# Patient Record
Sex: Female | Born: 1950 | Race: White | Hispanic: No | Marital: Married | Smoking: Former smoker
Health system: Southern US, Community
[De-identification: ages and names within clinical notes are randomized; demographics above are authoritative.]

## PROBLEM LIST (undated history)

## (undated) DIAGNOSIS — R5383 Other fatigue: Secondary | ICD-10-CM

## (undated) DIAGNOSIS — E559 Vitamin D deficiency, unspecified: Secondary | ICD-10-CM

## (undated) DIAGNOSIS — R002 Palpitations: Secondary | ICD-10-CM

## (undated) DIAGNOSIS — M949 Disorder of cartilage, unspecified: Secondary | ICD-10-CM

## (undated) DIAGNOSIS — M1990 Unspecified osteoarthritis, unspecified site: Secondary | ICD-10-CM

## (undated) DIAGNOSIS — M1710 Unilateral primary osteoarthritis, unspecified knee: Secondary | ICD-10-CM

## (undated) DIAGNOSIS — T7840XA Allergy, unspecified, initial encounter: Secondary | ICD-10-CM

## (undated) DIAGNOSIS — E785 Hyperlipidemia, unspecified: Secondary | ICD-10-CM

## (undated) DIAGNOSIS — M7710 Lateral epicondylitis, unspecified elbow: Secondary | ICD-10-CM

## (undated) DIAGNOSIS — R5381 Other malaise: Secondary | ICD-10-CM

## (undated) DIAGNOSIS — Z86010 Personal history of colonic polyps: Secondary | ICD-10-CM

## (undated) DIAGNOSIS — M76899 Other specified enthesopathies of unspecified lower limb, excluding foot: Secondary | ICD-10-CM

## (undated) DIAGNOSIS — M899 Disorder of bone, unspecified: Secondary | ICD-10-CM

## (undated) DIAGNOSIS — J309 Allergic rhinitis, unspecified: Secondary | ICD-10-CM

## (undated) DIAGNOSIS — N61 Mastitis without abscess: Secondary | ICD-10-CM

## (undated) HISTORY — DX: Mastitis without abscess: N61.0

## (undated) HISTORY — DX: Disorder of bone, unspecified: M89.9

## (undated) HISTORY — DX: Vitamin D deficiency, unspecified: E55.9

## (undated) HISTORY — DX: Allergy, unspecified, initial encounter: T78.40XA

## (undated) HISTORY — DX: Disorder of cartilage, unspecified: M94.9

## (undated) HISTORY — DX: Other specified enthesopathies of unspecified lower limb, excluding foot: M76.899

## (undated) HISTORY — DX: Unspecified osteoarthritis, unspecified site: M19.90

## (undated) HISTORY — DX: Palpitations: R00.2

## (undated) HISTORY — DX: Unilateral primary osteoarthritis, unspecified knee: M17.10

## (undated) HISTORY — DX: Allergic rhinitis, unspecified: J30.9

## (undated) HISTORY — DX: Lateral epicondylitis, unspecified elbow: M77.10

## (undated) HISTORY — DX: Hyperlipidemia, unspecified: E78.5

## (undated) HISTORY — PX: COLONOSCOPY: SHX174

## (undated) HISTORY — DX: Personal history of colonic polyps: Z86.010

## (undated) HISTORY — DX: Other fatigue: R53.83

## (undated) HISTORY — DX: Other malaise: R53.81

---

## 1986-08-04 HISTORY — PX: OTHER SURGICAL HISTORY: SHX169

## 2000-08-04 HISTORY — PX: OOPHORECTOMY: SHX86

## 2000-08-04 HISTORY — PX: ABDOMINAL HYSTERECTOMY: SHX81

## 2003-08-05 HISTORY — PX: OTHER SURGICAL HISTORY: SHX169

## 2003-08-25 ENCOUNTER — Encounter: Payer: Self-pay | Admitting: Internal Medicine

## 2003-08-25 LAB — HM COLONOSCOPY: HM Colonoscopy: ABNORMAL

## 2005-08-04 ENCOUNTER — Encounter: Payer: Self-pay | Admitting: Internal Medicine

## 2005-08-04 LAB — CONVERTED CEMR LAB

## 2005-11-10 ENCOUNTER — Ambulatory Visit: Payer: Self-pay | Admitting: Internal Medicine

## 2005-11-13 ENCOUNTER — Ambulatory Visit: Payer: Self-pay | Admitting: Cardiology

## 2005-12-15 ENCOUNTER — Ambulatory Visit: Payer: Self-pay | Admitting: Internal Medicine

## 2006-02-24 ENCOUNTER — Ambulatory Visit (HOSPITAL_COMMUNITY): Admission: RE | Admit: 2006-02-24 | Discharge: 2006-02-24 | Payer: Self-pay | Admitting: Internal Medicine

## 2006-04-09 ENCOUNTER — Ambulatory Visit: Payer: Self-pay | Admitting: Internal Medicine

## 2006-11-09 ENCOUNTER — Ambulatory Visit: Payer: Self-pay | Admitting: Internal Medicine

## 2006-11-09 LAB — CONVERTED CEMR LAB
AST: 26 units/L (ref 0–37)
Albumin: 3.7 g/dL (ref 3.5–5.2)
Basophils Absolute: 0.1 10*3/uL (ref 0.0–0.1)
Bilirubin Urine: NEGATIVE
Bilirubin, Direct: 0.1 mg/dL (ref 0.0–0.3)
Chloride: 102 meq/L (ref 96–112)
Cholesterol: 196 mg/dL (ref 0–200)
Eosinophils Absolute: 0.1 10*3/uL (ref 0.0–0.6)
GFR calc Af Amer: 166 mL/min
GFR calc non Af Amer: 137 mL/min
Glucose, Bld: 109 mg/dL — ABNORMAL HIGH (ref 70–99)
HDL: 60.2 mg/dL (ref >39.0)
Hemoglobin, Urine: NEGATIVE
Hemoglobin: 13.9 g/dL (ref 12.0–15.0)
Ketones, ur: NEGATIVE mg/dL
LDL Cholesterol: 114 mg/dL — ABNORMAL HIGH (ref 0–99)
Leukocytes, UA: NEGATIVE
MCHC: 34.3 g/dL (ref 30.0–36.0)
MCV: 86 fL (ref 78.0–100.0)
Monocytes Absolute: 0.6 10*3/uL (ref 0.2–0.7)
Monocytes Relative: 7.4 % (ref 3.0–11.0)
Neutrophils Relative %: 77.4 % — ABNORMAL HIGH (ref 43.0–77.0)
Nitrite: NEGATIVE
Potassium: 3.7 meq/L (ref 3.5–5.1)
RBC: 4.7 M/uL (ref 3.87–5.11)
RDW: 12.7 % (ref 11.5–14.6)
Sodium: 140 meq/L (ref 135–145)
Specific Gravity, Urine: >=1.03 (ref 1.000–1.03)
TSH: 1.8 microintl units/mL (ref 0.35–5.50)
Total CHOL/HDL Ratio: 3.3
Total Protein, Urine: NEGATIVE mg/dL
Urine Glucose: NEGATIVE mg/dL
Urobilinogen, UA: 0.2 (ref 0.0–1.0)
pH: 5.5 (ref 5.0–8.0)

## 2006-11-10 ENCOUNTER — Encounter: Payer: Self-pay | Admitting: Internal Medicine

## 2007-03-02 ENCOUNTER — Ambulatory Visit (HOSPITAL_COMMUNITY): Admission: RE | Admit: 2007-03-02 | Discharge: 2007-03-02 | Payer: Self-pay | Admitting: Internal Medicine

## 2007-04-04 ENCOUNTER — Encounter: Payer: Self-pay | Admitting: Internal Medicine

## 2007-04-04 DIAGNOSIS — M76899 Other specified enthesopathies of unspecified lower limb, excluding foot: Secondary | ICD-10-CM

## 2007-04-04 DIAGNOSIS — M1990 Unspecified osteoarthritis, unspecified site: Secondary | ICD-10-CM

## 2007-04-04 DIAGNOSIS — J309 Allergic rhinitis, unspecified: Secondary | ICD-10-CM

## 2007-04-04 DIAGNOSIS — Z860100 Personal history of colon polyps, unspecified: Secondary | ICD-10-CM

## 2007-04-04 DIAGNOSIS — E785 Hyperlipidemia, unspecified: Secondary | ICD-10-CM

## 2007-04-04 HISTORY — DX: Other specified enthesopathies of unspecified lower limb, excluding foot: M76.899

## 2007-04-04 HISTORY — DX: Personal history of colonic polyps: Z86.010

## 2007-04-04 HISTORY — DX: Unspecified osteoarthritis, unspecified site: M19.90

## 2007-04-04 HISTORY — DX: Allergic rhinitis, unspecified: J30.9

## 2007-04-04 HISTORY — DX: Personal history of colon polyps, unspecified: Z86.0100

## 2007-04-04 HISTORY — DX: Hyperlipidemia, unspecified: E78.5

## 2007-05-12 ENCOUNTER — Ambulatory Visit: Payer: Self-pay | Admitting: Internal Medicine

## 2007-05-12 ENCOUNTER — Encounter: Payer: Self-pay | Admitting: Internal Medicine

## 2007-05-12 DIAGNOSIS — R5381 Other malaise: Secondary | ICD-10-CM | POA: Insufficient documentation

## 2007-05-12 DIAGNOSIS — R002 Palpitations: Secondary | ICD-10-CM

## 2007-05-12 DIAGNOSIS — R5383 Other fatigue: Secondary | ICD-10-CM

## 2007-05-12 HISTORY — DX: Other malaise: R53.81

## 2007-05-12 HISTORY — DX: Palpitations: R00.2

## 2007-05-12 LAB — CONVERTED CEMR LAB
AST: 19 units/L (ref 0–37)
Albumin: 3.8 g/dL (ref 3.5–5.2)
Alkaline Phosphatase: 105 units/L (ref 39–117)
BUN: 14 mg/dL (ref 6–23)
Basophils Absolute: 0 10*3/uL (ref 0.0–0.1)
Basophils Relative: 0.1 % (ref 0.0–1.0)
CO2: 29 meq/L (ref 19–32)
Chloride: 104 meq/L (ref 96–112)
Creatinine, Ser: 0.6 mg/dL (ref 0.4–1.2)
HCT: 38.6 % (ref 36.0–46.0)
Hemoglobin: 13.3 g/dL (ref 12.0–15.0)
MCHC: 34.5 g/dL (ref 30.0–36.0)
Neutrophils Relative %: 81.8 % — ABNORMAL HIGH (ref 43.0–77.0)
RBC: 4.53 M/uL (ref 3.87–5.11)
RDW: 12.5 % (ref 11.5–14.6)
Sodium: 141 meq/L (ref 135–145)
Total Bilirubin: 0.9 mg/dL (ref 0.3–1.2)
Total Protein: 7.3 g/dL (ref 6.0–8.3)

## 2007-08-06 ENCOUNTER — Telehealth (INDEPENDENT_AMBULATORY_CARE_PROVIDER_SITE_OTHER): Payer: Self-pay | Admitting: *Deleted

## 2007-09-16 ENCOUNTER — Ambulatory Visit: Payer: Self-pay | Admitting: Cardiology

## 2007-09-23 ENCOUNTER — Ambulatory Visit: Payer: Self-pay | Admitting: Cardiology

## 2007-10-12 ENCOUNTER — Encounter: Payer: Self-pay | Admitting: Cardiology

## 2007-10-12 ENCOUNTER — Ambulatory Visit: Payer: Self-pay

## 2007-10-25 ENCOUNTER — Ambulatory Visit: Payer: Self-pay | Admitting: Cardiology

## 2008-02-03 ENCOUNTER — Ambulatory Visit: Payer: Self-pay | Admitting: Internal Medicine

## 2008-02-03 LAB — CONVERTED CEMR LAB
Alkaline Phosphatase: 95 units/L (ref 39–117)
Bilirubin Urine: NEGATIVE
Bilirubin, Direct: 0.1 mg/dL (ref 0.0–0.3)
Chloride: 105 meq/L (ref 96–112)
GFR calc Af Amer: 134 mL/min
Glucose, Bld: 89 mg/dL (ref 70–99)
HCT: 38.2 % (ref 36.0–46.0)
HDL: 55.4 mg/dL (ref >39.0)
Hemoglobin, Urine: NEGATIVE
Leukocytes, UA: NEGATIVE
Lymphocytes Relative: 15.8 % (ref 12.0–46.0)
Monocytes Absolute: 0.5 10*3/uL (ref 0.1–1.0)
Monocytes Relative: 8 % (ref 3.0–12.0)
Neutrophils Relative %: 73.8 % (ref 43.0–77.0)
Nitrite: NEGATIVE
Platelets: 228 10*3/uL (ref 150–400)
Potassium: 4.1 meq/L (ref 3.5–5.1)
RDW: 12.2 % (ref 11.5–14.6)
Sodium: 140 meq/L (ref 135–145)
Total CHOL/HDL Ratio: 3
Total Protein: 6.9 g/dL (ref 6.0–8.3)
Urobilinogen, UA: 0.2 (ref 0.0–1.0)
VLDL: 16 mg/dL (ref 0–40)

## 2008-02-25 ENCOUNTER — Ambulatory Visit: Payer: Self-pay | Admitting: Internal Medicine

## 2008-03-10 ENCOUNTER — Ambulatory Visit (HOSPITAL_COMMUNITY): Admission: RE | Admit: 2008-03-10 | Discharge: 2008-03-10 | Payer: Self-pay | Admitting: Internal Medicine

## 2008-05-04 ENCOUNTER — Ambulatory Visit: Payer: Self-pay | Admitting: Cardiology

## 2008-08-11 ENCOUNTER — Ambulatory Visit: Payer: Self-pay | Admitting: Internal Medicine

## 2008-08-11 DIAGNOSIS — N61 Mastitis without abscess: Secondary | ICD-10-CM

## 2008-08-11 HISTORY — DX: Mastitis without abscess: N61.0

## 2008-12-06 ENCOUNTER — Ambulatory Visit (HOSPITAL_BASED_OUTPATIENT_CLINIC_OR_DEPARTMENT_OTHER): Admission: RE | Admit: 2008-12-06 | Discharge: 2008-12-06 | Payer: Self-pay | Admitting: Orthopedic Surgery

## 2008-12-06 ENCOUNTER — Ambulatory Visit: Payer: Self-pay | Admitting: Diagnostic Radiology

## 2009-02-06 ENCOUNTER — Ambulatory Visit: Payer: Self-pay | Admitting: Internal Medicine

## 2009-02-06 LAB — CONVERTED CEMR LAB
ALT: 27 units/L (ref 0–35)
AST: 30 units/L (ref 0–37)
Bilirubin Urine: NEGATIVE
Bilirubin, Direct: 0.2 mg/dL (ref 0.0–0.3)
CO2: 30 meq/L (ref 19–32)
Calcium: 9.6 mg/dL (ref 8.4–10.5)
Eosinophils Relative: 1.4 % (ref 0.0–5.0)
Glucose, Bld: 81 mg/dL (ref 70–99)
HDL: 53.6 mg/dL (ref >39.00)
Ketones, ur: NEGATIVE mg/dL
Lymphocytes Relative: 16.9 % (ref 12.0–46.0)
Monocytes Relative: 8.3 % (ref 3.0–12.0)
Neutrophils Relative %: 73 % (ref 43.0–77.0)
Nitrite: NEGATIVE
Platelets: 203 10*3/uL (ref 150.0–400.0)
Potassium: 3.8 meq/L (ref 3.5–5.1)
Sodium: 142 meq/L (ref 135–145)
Total Bilirubin: 1.3 mg/dL — ABNORMAL HIGH (ref 0.3–1.2)
Total CHOL/HDL Ratio: 3
Total Protein, Urine: NEGATIVE mg/dL
VLDL: 20.2 mg/dL (ref 0.0–40.0)
WBC: 6.5 10*3/uL (ref 4.5–10.5)
pH: 5.5 (ref 5.0–8.0)

## 2009-02-12 ENCOUNTER — Ambulatory Visit: Payer: Self-pay | Admitting: Internal Medicine

## 2009-02-12 DIAGNOSIS — IMO0002 Reserved for concepts with insufficient information to code with codable children: Secondary | ICD-10-CM

## 2009-02-12 DIAGNOSIS — E559 Vitamin D deficiency, unspecified: Secondary | ICD-10-CM

## 2009-02-12 DIAGNOSIS — M1710 Unilateral primary osteoarthritis, unspecified knee: Secondary | ICD-10-CM

## 2009-02-12 HISTORY — DX: Reserved for concepts with insufficient information to code with codable children: IMO0002

## 2009-02-12 HISTORY — DX: Vitamin D deficiency, unspecified: E55.9

## 2009-02-13 LAB — CONVERTED CEMR LAB: Vit D, 25-Hydroxy: 30 ng/mL (ref 30–89)

## 2009-03-12 ENCOUNTER — Telehealth: Payer: Self-pay | Admitting: Internal Medicine

## 2009-04-04 ENCOUNTER — Encounter: Payer: Self-pay | Admitting: Internal Medicine

## 2009-04-05 ENCOUNTER — Ambulatory Visit (HOSPITAL_COMMUNITY): Admission: RE | Admit: 2009-04-05 | Discharge: 2009-04-05 | Payer: Self-pay | Admitting: Internal Medicine

## 2009-04-05 ENCOUNTER — Encounter: Payer: Self-pay | Admitting: Internal Medicine

## 2009-04-23 ENCOUNTER — Telehealth: Payer: Self-pay | Admitting: Internal Medicine

## 2010-03-06 ENCOUNTER — Ambulatory Visit: Payer: Self-pay | Admitting: Internal Medicine

## 2010-03-07 ENCOUNTER — Encounter: Payer: Self-pay | Admitting: Internal Medicine

## 2010-03-07 LAB — CONVERTED CEMR LAB
ALT: 14 units/L (ref 0–35)
AST: 25 units/L (ref 0–37)
Albumin: 3.9 g/dL (ref 3.5–5.2)
Alkaline Phosphatase: 102 units/L (ref 39–117)
BUN: 18 mg/dL (ref 6–23)
Basophils Relative: 0.4 % (ref 0.0–3.0)
CO2: 30 meq/L (ref 19–32)
Chloride: 100 meq/L (ref 96–112)
Direct LDL: 162 mg/dL
Eosinophils Relative: 1.6 % (ref 0.0–5.0)
GFR calc non Af Amer: 105.53 mL/min (ref >60)
Glucose, Bld: 94 mg/dL (ref 70–99)
MCV: 86 fL (ref 78.0–100.0)
Monocytes Absolute: 0.5 10*3/uL (ref 0.1–1.0)
Monocytes Relative: 8.5 % (ref 3.0–12.0)
Neutrophils Relative %: 71.6 % (ref 43.0–77.0)
Nitrite: NEGATIVE
Potassium: 4 meq/L (ref 3.5–5.1)
RBC: 4.59 M/uL (ref 3.87–5.11)
Specific Gravity, Urine: >=1.03 (ref 1.000–1.030)
TSH: 2.02 microintl units/mL (ref 0.35–5.50)
Total Protein, Urine: NEGATIVE mg/dL
Total Protein: 7.1 g/dL (ref 6.0–8.3)
Urine Glucose: NEGATIVE mg/dL
Vit D, 25-Hydroxy: 33 ng/mL (ref 30–89)
WBC: 6.1 10*3/uL (ref 4.5–10.5)
pH: 6 (ref 5.0–8.0)

## 2010-03-13 ENCOUNTER — Encounter: Payer: Self-pay | Admitting: Internal Medicine

## 2010-03-13 ENCOUNTER — Ambulatory Visit: Payer: Self-pay | Admitting: Internal Medicine

## 2010-03-13 DIAGNOSIS — M7710 Lateral epicondylitis, unspecified elbow: Secondary | ICD-10-CM | POA: Insufficient documentation

## 2010-03-13 DIAGNOSIS — M899 Disorder of bone, unspecified: Secondary | ICD-10-CM

## 2010-03-13 DIAGNOSIS — M949 Disorder of cartilage, unspecified: Secondary | ICD-10-CM

## 2010-03-13 HISTORY — DX: Lateral epicondylitis, unspecified elbow: M77.10

## 2010-03-13 HISTORY — DX: Disorder of bone, unspecified: M89.9

## 2010-04-09 ENCOUNTER — Ambulatory Visit (HOSPITAL_COMMUNITY): Admission: RE | Admit: 2010-04-09 | Discharge: 2010-04-09 | Payer: Self-pay | Admitting: Internal Medicine

## 2010-04-09 LAB — HM MAMMOGRAPHY: HM Mammogram: NEGATIVE

## 2010-09-03 NOTE — Assessment & Plan Note (Signed)
Summary: PHYSICAL  PER PT  STC   Vital Signs:  Patient profile:   60 year old female Height:      64 inches Weight:      163.50 pounds BMI:     28.17 O2 Sat:      99 % on Room air Temp:     97.6 degrees F oral Pulse rate:   70 / minute BP sitting:   110 / 72  (left arm) Cuff size:   regular  Vitals Entered By: Zella Ball Ewing CMA Duncan Dull) (March 13, 2010 8:47 AM)  O2 Flow:  Room air  Preventive Care Screening  Mammogram:    Next Due:  04/2010  Colonoscopy:    Date:  08/25/2003    Next Due:  09/2010    Results:  abnormal   Last Tetanus Booster:    Date:  03/13/2010    Results:  Tdap  Pap Smear:    Date:  10/02/2009    Results:  normal   CC: Adult Physical/Re   CC:  Adult Physical/Re.  History of Present Illness: here to f/u;  taking the vit d well, but stopped the lipitor to see if diet alone would work; no apparent myalgias or side effects; Pt denies CP, sob, doe, wheezing, orthopnea, pnd, worsening LE edema, palps, dizziness or syncope  Pt denies new neuro symptoms such as headache, facial or extremity weakness  Does have some  mild right elbow pain, swelling  worse with more computer typing, better with the nsaid, overall mild. Right hip bursitis has improved  overall ;  Has been doing more wlaking - daily for 3 mile s- sometime farther;  runs for 3/4 mile now stopping running.  Lost 14 lbs overall per pt.  No fever, wt loss, night sweats, loss of appetite or other constitutional symptoms  Denies depressive symtpoms or suicidal ideation, no panic. Now retired, but may go back part -time.    Preventive Screening-Counseling & Management      Drug Use:  no.    Problems Prior to Update: 1)  Osteoarthritis, Knee, Right  (ICD-715.96) 2)  Unspecified Vitamin D Deficiency  (ICD-268.9) 3)  Preventive Health Care  (ICD-V70.0) 4)  Mastitis  (ICD-611.0) 5)  Preventive Health Care  (ICD-V70.0) 6)  Fatigue  (ICD-780.79) 7)  Palpitations  (ICD-785.1) 8)  Family History of Cad  Female 1st Degree Relative <60  (ICD-V16.49) 9)  Osteoarthritis  (ICD-715.90) 10)  Hyperlipidemia  (ICD-272.4) 11)  Colonic Polyps, Hx of  (ICD-V12.72) 12)  Allergic Rhinitis  (ICD-477.9) 13)  Bursitis, Right Hip  (ICD-726.5)  Medications Prior to Update: 1)  Lipitor 80 Mg  Tabs (Atorvastatin Calcium) .Marland Kitchen.. 1 By Mouth Once Daily 2)  Vitamin D3 1000 Unit  Tabs (Cholecalciferol) .Marland Kitchen.. 1 By Mouth Once Daily 3)  Diclofenac Sodium 75 Mg Tbec (Diclofenac Sodium) .Marland Kitchen.. 1 By Mouth Two Times A Day  Current Medications (verified): 1)  Lipitor 80 Mg  Tabs (Atorvastatin Calcium) .Marland Kitchen.. 1 By Mouth Once Daily 2)  Vitamin D3 1000 Unit  Tabs (Cholecalciferol) .Marland Kitchen.. 1 By Mouth Once Daily 3)  Diclofenac Sodium 75 Mg Tbec (Diclofenac Sodium) .Marland Kitchen.. 1 By Mouth Two Times A Day As Needed Pain 4)  Aspir-Low 81 Mg Tbec (Aspirin) .Marland Kitchen.. 1po Once Daily  Allergies (verified): 1)  ! 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 Hemorrhoidectomy-1988 Hysterectomy- 2002 Kidney Stone Removal- 2005 Oophorectomy- 2002  Family History: Reviewed history from 05/12/2007 and no changes required. Family History of CAD Female 1st degree relative <60 sister with prot s defic  Social History: Reviewed history from 05/12/2007 and no changes required. 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         all otherwise negative per pt -    Physical Exam  General:  alert and overweight-appearing.   Head:  normocephalic and atraumatic.   Eyes:  vision grossly intact, pupils equal, and pupils round.   Ears:  R ear normal and L ear normal.   Nose:  no external deformity and no nasal discharge.   Mouth:  no gingival abnormalities and pharynx pink and moist.   Neck:  supple and no masses.   Lungs:  normal respiratory effort and normal breath sounds.   Heart:  normal rate and regular rhythm.   Abdomen:  soft, non-tender, and normal bowel sounds.   Msk:  no joint tenderness and no joint swelling.  except for mild tender, swelling to right epicondylar area Extremities:  no edema, no erythema  Neurologic:  cranial nerves II-XII intact and strength normal in all extremities.     Impression & Recommendations:  Problem # 1:  Preventive Health Care (ICD-V70.0) Overall doing well, age appropriate education and counseling updated and referral for appropriate preventive services done unless declined, immunizations up to date or declined, diet counseling done if overweight, urged to quit smoking if smokes , most recent labs reviewed and current ordered if appropriate, ecg reviewed or declined (interpretation per ECG scanned in the EMR if done); information regarding Medicare Prevention requirements given if appropriate; speciality referrals updated as appropriate , to also start asa81 mg - 1 per day Orders: EKG w/ Interpretation  (93000)  Problem # 2:  HYPERLIPIDEMIA (ICD-272.4)  Her updated medication list for this problem includes:    Lipitor 80 Mg Tabs (Atorvastatin calcium) .Marland Kitchen... 1 by mouth once daily to re-start lipitor  - gave rx   Problem # 3:  LATERAL EPICONDYLITIS, RIGHT (ICD-726.32) mild , for nsaid as needed, f/u any worsening symptoms  Complete Medication List: 1)  Lipitor 80 Mg Tabs (Atorvastatin calcium) .Marland Kitchen.. 1 by mouth once daily 2)  Vitamin D3 1000 Unit Tabs (Cholecalciferol) .Marland Kitchen.. 1 by mouth once daily 3)  Diclofenac Sodium 75 Mg Tbec (Diclofenac sodium) .Marland Kitchen.. 1 by mouth two times a day as needed pain 4)  Aspir-low 81 Mg Tbec (Aspirin) .Marland Kitchen.. 1po once daily  Other Orders: Tdap => 30yrs IM (40102) Admin 1st Vaccine (72536)  Patient Instructions: 1)  you had the tetanus shot today 2)  Continue all previous medications as before this visit , including re-start the lipitor 3)  Take an Aspirin every  day - 81 mg - 1 per day - COATED only 4)  Please schedule a follow-up appointment in 1 year or sooner if needed Prescriptions: DICLOFENAC SODIUM 75 MG TBEC (DICLOFENAC SODIUM) 1 by mouth two times a day as needed pain  #60 x 3   Entered and Authorized by:   Corwin Levins MD   Signed by:   Corwin Levins MD on 03/13/2010   Method used:   Print then Give to Patient   RxID:   [id number] LIPITOR 80 MG  TABS (ATORVASTATIN CALCIUM) 1 by mouth once daily  #90 x 3   Entered and Authorized by:   Corwin Levins MD   Signed by:   Corwin Levins MD on 03/13/2010   Method used:   Print then Give to Patient   RxID:   [id number]    Immunizations Administered:  Tetanus Vaccine:    Vaccine Type: Tdap    Site: left deltoid    Mfr: GlaxoSmithKline    Dose: 0.5 ml    Route: IM    Given by: Zella Ball Ewing CMA (AAMA)    Exp. Date: 02/01/2012    Lot #: [id number]    VIS given: 06/22/07 version given March 13, 2010.

## 2010-09-03 NOTE — Procedures (Signed)
Summary: Kathrene Alu MD  Kathrene Alu MD   Imported By: Lester Adrian 03/15/2010 08:02:48  _____________________________________________________________________  External Attachment:    Type:   Image     Comment:   External Document

## 2010-12-17 NOTE — Assessment & Plan Note (Signed)
[facility]                            CARDIOLOGY OFFICE NOTE   NAME:NOWLINNickisha, Hum                    MRN:          [id number]  DATE:09/16/2007                            DOB:          20-Dec-1950    [name] is a very pleasant 60 year old female who presents  evaluation of chest pain and palpitations.  She has no prior cardiac  history.  She typically does not have significant dyspnea on exertion,  orthopnea, PND, exertional chest pain or syncope.  Since the fall she  has had intermittent palpitations.  These were sudden onset and  described as her heart racing.  They last 5 to 10 minutes and resolve  spontaneously.  There is mild presyncope, but there is no syncope.  There is also a tightness in her chest as well as dyspnea.  These have  increased in frequency recently and are occurring at least one time per  week.  We would therefore asked to further evaluate.  Note, she has not  had exertional chest pain at other times.  Her medications include  indomethacin 25 mg tablets 2 p.o. every 12 hours, Lipitor 80 mg p.o.  daily, and estradiol.  She has an allergy to SULFA.   FAMILY HISTORY:  Is positive coronary disease.  Her father had  myocardial infarction and died in his mid 67s.  Her mother had coronary  disease but it was in her 68s.   SOCIAL HISTORY:  She does not smoke.  She consumes approximately 2  alcoholic beverages per week.   PAST MEDICAL HISTORY:  There is no diabetes mellitus, hypertension, but  there is hyperlipidemia.  She has some bursitis in her right hip.  She  had a history of nephrolithiasis.  She is had a prior hysterectomy as  well as hemorrhoidectomy.   REVIEW OF SYSTEMS:  She denies any headaches at present although she has  had some frontal headaches over the right eye recently.  There is also  occasionally dizziness with certain movements.  She has had not had a  productive cough.  There is no hemoptysis.  There is  no dysphagia,  odynophagia, melena or hematochezia.  There is no rash or seizure  activity.  There is no orthopnea, PND or pedal edema.  Remaining systems  are negative.   PHYSICAL EXAMINATION:  Today shows a blood pressure of 119/74, pulse is  63.  She weighs 108 pounds.  She is well-developed, somewhat obese.  She is no acute distress at  present point at present.  Her skin is warm and dry.  She is not depressed.  There is no pleural  clubbing.  BACK:  Normal.  HEENT:  Normal with normal eyelids.  NECK:  Supple with normal upstroke bilaterally.  No bruits noted.  There  is no jugular distention and no thyromegaly noted.  Chest is clear to auscultation.  Normal expansion.  CARDIOVASCULAR:  Regular rhythm.  Normal S1-S2.  No murmurs, rubs or  gallops noted.  PMI is non is not palpated.  ABDOMEN:  Exam nontender.  Positive bowel sounds hepatosplenomegaly no  mass appreciated.  There is no abdominal bruit.  She is 2+ femoral  pulses bilaterally.  No bruits.  EXTREMITIES:  Show no edema and I palpate no cords.  She is 2+ posterior  tibial pulses bilaterally.  NEUROLOGIC:  Grossly intact.   Her electrocardiogram shows a sinus rhythm at a rate of 63.  The axis is  normal.  No significant ST changes.   DIAGNOSES:  1. Palpitations - description of her symptoms is concerning for an      SVT.  We will schedule her an event monitor to more fully evaluate.      If it indeed is SVT we could treat with a beta blocker versus refer      to the EP for consideration of ablation.  I will also schedule her      to have an echocardiogram to quantify LV function.  We will also      check a TSH.  2. Chest pain - her symptoms occur only with palpitations.  She does      not have this other times.  I will check a stress echocardiogram in      light of her family history.  If it shows no significant      abnormalities,  we will not pursue further evaluation.  3. Hyperlipidemia - she will continue on  statin and this is being      managed by Dr. Jonny Ruiz.  4. History of nephrolithiasis.   I will see her back in 6-8 weeks to review the above information.     Madolyn Frieze Jens Som, MD, [facility]  Electronically Signed    BSC/MedQ  DD: 09/16/2007  DT: 09/17/2007  Job #: [id number]   cc:   Corwin Levins, MD

## 2010-12-17 NOTE — Assessment & Plan Note (Signed)
[facility]                            CARDIOLOGY OFFICE NOTE   NAME:[name], [name]                    MRN:          [id number]  DATE:10/25/2007                            DOB:          07/02/1951    [name] is a 60 year old female whom I recently saw on September 16, 2007, for chest pain and palpitations.  We did schedule the patient to  have an echocardiogram which was performed on October 12, 2007.  Ejection  fraction was normal.  There is mild LVH.  There was trivial mitral  regurgitation.  She also had a stress echocardiogram.  The patient  exercised for 4 minutes 13 seconds.  There was no chest pain during the  study.  There were no electrographic changes.  There were no stress-  induced wall motion abnormalities.  Finally, we performed a CardioNet  monitor.  There were occasional PVCs associated with the palpitations.  Since then, she has not had dyspnea. She occasionally feels a brief,  sharp pain in her chest but does not have exertional chest pain.  She  does continue to have palpitations.   MEDICATIONS:  1. Lipitor 80 mg p.o. daily.  2. Estradiol.   PHYSICAL EXAMINATION:  VITAL SIGNS:  Exam today shows a blood pressure  of 126/91 and pulse of 85.  She weighs 182 pounds.  HEENT:  Normal.  NECK:  Supple.  CHEST:  Clear.  CARDIOVASCULAR:  Regular rate.  ABDOMEN:  Exam shows no tenderness.  EXTREMITIES:  Show no edema.   DIAGNOSES:  1. Palpitations felt secondary to PACs.  These do continue to be      symptomatic.  We will, therefore, add atenolol 25 mg p.o. daily to      see if this will help her symptoms.  She will take this in the      evening.  2. Chest pain.  Her stress echocardiogram was unremarkable and will      not pursue further ischemia evaluation.  3. Hyperlipidemia.  She will continue on a statin,  and this is      being managed by Dr. Jonny Ruiz.  4. History of nephrolithiasis.   We will see back in 6 months.     Madolyn Frieze Jens Som, MD, [facility]  Electronically Signed   BSC/MedQ  DD: 10/25/2007  DT: 10/25/2007  Job #: [id number]   cc:   Corwin Levins, MD

## 2010-12-17 NOTE — Assessment & Plan Note (Signed)
[facility]                            CARDIOLOGY OFFICE NOTE   NAME:NOWLINHavanna, [name]                    MRN:          [id number]  DATE:05/04/2008                            DOB:          [birth year]    [name] is a pleasant 60 year old female who has a history of chest  pain and palpitations.  Previous stress echocardiogram performed on  October 12, 2007, showed no significant abnormalities.  She also had a  previous echocardiogram on October 12, 2007, that showed normal LV  function, trivial mitral regurgitation.  A previous TSH was normal as  well.  A CardioNet showed occasional PVCs associated with palpitations.  We did place her on atenolol in March; however, she has since  discontinued this, and her palpitations have completely resolved.  She  is not having any chest pain or dyspnea, and there is no pedal edema.   Her medications include,  1. Lipitor 80 mg p.o. daily.  2. Estradiol.  3. Nalfon 200 mg p.o. t.i.d.   PHYSICAL EXAMINATION:  VITAL SIGNS:  Blood pressure of 118/56 and a  pulse of 71.  HEENT:  Normal.  NECK:  Supple.  CHEST:  Clear.  CARDIOVASCULAR:  Regular rate and rhythm.  ABDOMEN:  No tenderness.  EXTREMITIES:  No edema.   Electrocardiogram shows sinus rhythm at a rate of 68.  There are no  significant ST changes.   DIAGNOSES:  1. History of palpitations, felt secondary to premature atrial      contractions and premature ventricular contractions - each had      completely resolved and she is now off atenolol.  I have asked her      to take this on an as-needed basis.  2. History of chest pain - this has completely resolved as well, and      her previous stress echocardiogram was normal.  We will not pursue      this further.  3. Hyperlipidemia - she will continue on her statin and this is being      followed by Dr. Jonny Ruiz.  4. History of nephrolithiasis.   We will see back on an as-needed basis.     [name]  Mary Som, MD, [facility]  Electronically Signed    BSC/MedQ  DD: 05/04/2008  DT: 05/05/2008  Job #: [id number]

## 2011-03-18 ENCOUNTER — Telehealth: Payer: Self-pay

## 2011-03-18 DIAGNOSIS — M8580 Other specified disorders of bone density and structure, unspecified site: Secondary | ICD-10-CM

## 2011-03-18 NOTE — Telephone Encounter (Signed)
Done per emr 

## 2011-03-18 NOTE — Telephone Encounter (Signed)
Pt called requesting order to [facility] for a Dexa scan.

## 2011-03-19 ENCOUNTER — Other Ambulatory Visit: Payer: Self-pay | Admitting: Internal Medicine

## 2011-03-19 DIAGNOSIS — Z1231 Encounter for screening mammogram for malignant neoplasm of breast: Secondary | ICD-10-CM

## 2011-03-19 NOTE — Telephone Encounter (Signed)
Pt informed

## 2011-03-21 ENCOUNTER — Other Ambulatory Visit: Payer: Self-pay | Admitting: Internal Medicine

## 2011-03-28 ENCOUNTER — Telehealth: Payer: Self-pay

## 2011-03-28 DIAGNOSIS — Z0000 Encounter for general adult medical examination without abnormal findings: Secondary | ICD-10-CM

## 2011-03-28 NOTE — Telephone Encounter (Signed)
Put order in for labs. 

## 2011-04-16 ENCOUNTER — Ambulatory Visit (HOSPITAL_COMMUNITY)
Admission: RE | Admit: 2011-04-16 | Discharge: 2011-04-16 | Disposition: A | Discharge status: Home / Self Care | Payer: BC Managed Care – PPO | Source: Ambulatory Visit | Attending: Internal Medicine | Admitting: Internal Medicine

## 2011-04-16 DIAGNOSIS — Z1231 Encounter for screening mammogram for malignant neoplasm of breast: Secondary | ICD-10-CM | POA: Insufficient documentation

## 2011-04-16 DIAGNOSIS — M8580 Other specified disorders of bone density and structure, unspecified site: Secondary | ICD-10-CM

## 2011-04-29 ENCOUNTER — Encounter: Payer: Self-pay | Admitting: Internal Medicine

## 2011-05-08 ENCOUNTER — Encounter: Payer: Self-pay | Admitting: Internal Medicine

## 2011-05-26 ENCOUNTER — Other Ambulatory Visit (INDEPENDENT_AMBULATORY_CARE_PROVIDER_SITE_OTHER): Payer: BC Managed Care – PPO

## 2011-05-26 DIAGNOSIS — Z0000 Encounter for general adult medical examination without abnormal findings: Secondary | ICD-10-CM

## 2011-05-26 LAB — URINALYSIS, ROUTINE W REFLEX MICROSCOPIC
Bilirubin Urine: NEGATIVE
Ketones, ur: NEGATIVE
Leukocytes, UA: NEGATIVE
Urobilinogen, UA: 0.2 (ref 0.0–1.0)

## 2011-05-26 LAB — BASIC METABOLIC PANEL
BUN: 15 mg/dL (ref 6–23)
CO2: 29 mEq/L (ref 19–32)
Chloride: 105 mEq/L (ref 96–112)
Glucose, Bld: 88 mg/dL (ref 70–99)
Potassium: 4.4 mEq/L (ref 3.5–5.1)

## 2011-05-26 LAB — HEPATIC FUNCTION PANEL
ALT: 24 U/L (ref 0–35)
AST: 26 U/L (ref 0–37)
Albumin: 3.9 g/dL (ref 3.5–5.2)
Total Protein: 7 g/dL (ref 6.0–8.3)

## 2011-05-26 LAB — LIPID PANEL
LDL Cholesterol: 92 mg/dL (ref 0–99)
Total CHOL/HDL Ratio: 3
VLDL: 19.2 mg/dL (ref 0.0–40.0)

## 2011-05-26 LAB — CBC WITH DIFFERENTIAL/PLATELET
Basophils Relative: 0.6 % (ref 0.0–3.0)
Eosinophils Relative: 1.7 % (ref 0.0–5.0)
Lymphocytes Relative: 18 % (ref 12.0–46.0)
MCV: 87.2 fl (ref 78.0–100.0)
Monocytes Absolute: 0.4 10*3/uL (ref 0.1–1.0)
Neutrophils Relative %: 70.3 % (ref 43.0–77.0)
RBC: 4.44 Mil/uL (ref 3.87–5.11)
WBC: 4.7 10*3/uL (ref 4.5–10.5)

## 2011-05-26 LAB — TSH: TSH: 1.48 u[IU]/mL (ref 0.35–5.50)

## 2011-06-01 ENCOUNTER — Encounter: Payer: Self-pay | Admitting: Internal Medicine

## 2011-06-01 DIAGNOSIS — Z0001 Encounter for general adult medical examination with abnormal findings: Secondary | ICD-10-CM | POA: Insufficient documentation

## 2011-06-01 DIAGNOSIS — Z0000 Encounter for general adult medical examination without abnormal findings: Secondary | ICD-10-CM

## 2011-06-02 ENCOUNTER — Encounter: Payer: Self-pay | Admitting: Internal Medicine

## 2011-06-02 ENCOUNTER — Ambulatory Visit (INDEPENDENT_AMBULATORY_CARE_PROVIDER_SITE_OTHER): Payer: BC Managed Care – PPO | Admitting: Internal Medicine

## 2011-06-02 VITALS — BP 112/80 | HR 60 | Temp 98.0°F | Ht 63.5 in | Wt 162.5 lb

## 2011-06-02 DIAGNOSIS — Z23 Encounter for immunization: Secondary | ICD-10-CM

## 2011-06-02 DIAGNOSIS — Z0000 Encounter for general adult medical examination without abnormal findings: Secondary | ICD-10-CM

## 2011-06-02 MED ORDER — DICLOFENAC SODIUM 75 MG PO TBEC: 75.0000 mg | DELAYED_RELEASE_TABLET | Freq: Every day | ORAL | Status: DC

## 2011-06-02 MED ORDER — ATORVASTATIN CALCIUM 80 MG PO TABS: 80.0000 mg | ORAL_TABLET | Freq: Every day | ORAL | Status: DC

## 2011-06-02 NOTE — Patient Instructions (Signed)
You had the flu shot today Continue all other medications as before Please remember to followup with your GYN for the yearly pap smear and/or mammogram - about April 2013 Please return in 1 year for your yearly visit, or sooner if needed, with Lab testing done 3-5 days before

## 2011-06-02 NOTE — Assessment & Plan Note (Signed)

## 2011-06-02 NOTE — Progress Notes (Signed)
Subjective:    Patient ID: [name], female    DOB: Dec 11, 1950, 60 y.o.   MRN: [id number]  HPI  Here for wellness and f/u;  Overall doing ok;  Pt denies CP, worsening SOB, DOE, wheezing, orthopnea, PND, worsening LE edema, palpitations, dizziness or syncope.  Pt denies neurological change such as new Headache, facial or extremity weakness.  Pt denies polydipsia, polyuria, or low sugar symptoms. Pt states overall good compliance with treatment and medications, good tolerability, and trying to follow lower cholesterol diet.  Pt denies worsening depressive symptoms, suicidal ideation or panic. No fever, wt loss, night sweats, loss of appetite, or other constitutional symptoms.  Pt states good ability with ADL's, low fall risk, home safety reviewed and adequate, no significant changes in hearing or vision, and active with exercise near daily.  Due for flu shot.  No other acute complaints.   Past Medical History  Diagnosis Date  . ALLERGIC RHINITIS 04/04/2007  . BURSITIS, RIGHT HIP 04/04/2007  . COLONIC POLYPS, HX OF 04/04/2007  . FATIGUE 05/12/2007  . HYPERLIPIDEMIA 04/04/2007  . LATERAL EPICONDYLITIS, RIGHT 03/13/2010  . 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 OSTEOARTHRITIS, KNEE, RIGHT 02/12/2009  . OSTEOARTHRITIS 04/04/2007  . OSTEOPENIA 03/13/2010  . Palpitations 05/12/2007  . Unspecified vitamin D deficiency 02/12/2009   Past Surgical History  Procedure Date  . Hemorrhoidectomoy 1988  . Abdominal hysterectomy 2002  . Kidney stone removal 2005  . Oophorectomy 2002    reports that she has never smoked. She does not have any smokeless tobacco history on file. She reports that she drinks alcohol. She reports that she does not use illicit drugs. family history includes Coronary artery disease in her paternal grandfather. Allergies  Allergen Reactions  . Sulfonamide Derivatives    Current Outpatient Prescriptions on File Prior to Visit  Medication Sig Dispense Refill  . aspirin 81 MG tablet Take 81 mg by  mouth daily.        . Cholecalciferol (VITAMIN D3) 1000 UNITS CAPS Take by mouth daily.         Review of Systems Review of Systems  Constitutional: Negative for diaphoresis, activity change, appetite change and unexpected weight change.  HENT: Negative for hearing loss, ear pain, facial swelling, mouth sores and neck stiffness.   Eyes: Negative for pain, redness and visual disturbance.  Respiratory: Negative for shortness of breath and wheezing.   Cardiovascular: Negative for chest pain and palpitations.  Gastrointestinal: Negative for diarrhea, blood in stool, abdominal distention and rectal pain.  Genitourinary: Negative for hematuria, flank pain and decreased urine volume.  Musculoskeletal: Negative for myalgias and joint swelling.  Skin: Negative for color change and wound.  Neurological: Negative for syncope and numbness.  Hematological: Negative for adenopathy.  Psychiatric/Behavioral: Negative for hallucinations, self-injury, decreased concentration and agitation.      Objective:   Physical Exam BP 112/80  Pulse 60  Temp(Src) 98 F (36.7 C) (Oral)  Ht 5' 3.5" (1.613 m)  Wt 162 lb 8 oz (73.71 kg)  BMI 28.33 kg/m2  SpO2 97% Physical Exam  VS noted, obese Constitutional: Pt is oriented to person, place, and time. Appears well-developed and well-nourished.  HENT:  Head: Normocephalic and atraumatic.  Right Ear: External ear normal.  Left Ear: External ear normal.  Nose: Nose normal.  Mouth/Throat: Oropharynx is clear and moist.  Eyes: Conjunctivae and EOM are normal. Pupils are equal, round, and reactive to light.  Neck: Normal range of motion. Neck supple. No JVD present. No tracheal deviation present.  Cardiovascular: Normal rate, regular rhythm, normal heart sounds and intact distal pulses.   Pulmonary/Chest: Effort normal and breath sounds normal.  Abdominal: Soft. Bowel sounds are normal. There is no tenderness.  Musculoskeletal: Normal range of motion. Exhibits no  edema.  Lymphadenopathy:  Has no cervical adenopathy.  Neurological: Pt is alert and oriented to person, place, and time. Pt has normal reflexes. No cranial nerve deficit.  Skin: Skin is warm and dry. No rash noted.  Psychiatric:  Has  normal mood and affect. Behavior is normal.     Assessment & Plan:

## 2012-03-23 ENCOUNTER — Other Ambulatory Visit: Payer: Self-pay | Admitting: Internal Medicine

## 2012-03-23 DIAGNOSIS — Z1231 Encounter for screening mammogram for malignant neoplasm of breast: Secondary | ICD-10-CM

## 2012-04-16 ENCOUNTER — Ambulatory Visit (HOSPITAL_COMMUNITY)
Admission: RE | Admit: 2012-04-16 | Discharge: 2012-04-16 | Disposition: A | Discharge status: Home / Self Care | Payer: BC Managed Care – PPO | Source: Ambulatory Visit | Attending: Internal Medicine | Admitting: Internal Medicine

## 2012-04-16 DIAGNOSIS — Z1231 Encounter for screening mammogram for malignant neoplasm of breast: Secondary | ICD-10-CM | POA: Insufficient documentation

## 2012-05-27 ENCOUNTER — Other Ambulatory Visit (INDEPENDENT_AMBULATORY_CARE_PROVIDER_SITE_OTHER): Payer: BC Managed Care – PPO

## 2012-05-27 DIAGNOSIS — Z0000 Encounter for general adult medical examination without abnormal findings: Secondary | ICD-10-CM

## 2012-05-27 LAB — URINALYSIS, ROUTINE W REFLEX MICROSCOPIC
Hgb urine dipstick: NEGATIVE
Ketones, ur: NEGATIVE
Leukocytes, UA: NEGATIVE
Specific Gravity, Urine: 1.02 (ref 1.000–1.030)
Urobilinogen, UA: 0.2 (ref 0.0–1.0)

## 2012-05-27 LAB — CBC WITH DIFFERENTIAL/PLATELET
Basophils Relative: 0.7 % (ref 0.0–3.0)
Eosinophils Absolute: 0.1 10*3/uL (ref 0.0–0.7)
Eosinophils Relative: 1.1 % (ref 0.0–5.0)
HCT: 41.6 % (ref 36.0–46.0)
Lymphs Abs: 1 10*3/uL (ref 0.7–4.0)
MCHC: 33.3 g/dL (ref 30.0–36.0)
MCV: 87.3 fl (ref 78.0–100.0)
Monocytes Absolute: 0.5 10*3/uL (ref 0.1–1.0)
Neutrophils Relative %: 74.2 % (ref 43.0–77.0)
RBC: 4.77 Mil/uL (ref 3.87–5.11)

## 2012-05-28 LAB — HEPATIC FUNCTION PANEL
ALT: 28 U/L (ref 0–35)
Albumin: 3.8 g/dL (ref 3.5–5.2)
Bilirubin, Direct: 0.2 mg/dL (ref 0.0–0.3)
Total Protein: 7.6 g/dL (ref 6.0–8.3)

## 2012-05-28 LAB — LIPID PANEL
Cholesterol: 189 mg/dL (ref 0–200)
HDL: 53.2 mg/dL (ref >39.00)
LDL Cholesterol: 114 mg/dL — ABNORMAL HIGH (ref 0–99)
Triglycerides: 108 mg/dL (ref 0.0–149.0)
VLDL: 21.6 mg/dL (ref 0.0–40.0)

## 2012-05-28 LAB — BASIC METABOLIC PANEL
Chloride: 106 mEq/L (ref 96–112)
GFR: 99.15 mL/min (ref >60.00)
Potassium: 4.2 mEq/L (ref 3.5–5.1)
Sodium: 140 mEq/L (ref 135–145)

## 2012-06-02 ENCOUNTER — Encounter: Payer: Self-pay | Admitting: Internal Medicine

## 2012-06-02 ENCOUNTER — Ambulatory Visit (INDEPENDENT_AMBULATORY_CARE_PROVIDER_SITE_OTHER): Payer: BC Managed Care – PPO | Admitting: Internal Medicine

## 2012-06-02 VITALS — BP 110/70 | HR 84 | Temp 97.5°F | Ht 64.0 in | Wt 169.1 lb

## 2012-06-02 DIAGNOSIS — Z0000 Encounter for general adult medical examination without abnormal findings: Secondary | ICD-10-CM

## 2012-06-02 DIAGNOSIS — Z23 Encounter for immunization: Secondary | ICD-10-CM

## 2012-06-02 MED ORDER — ATORVASTATIN CALCIUM 80 MG PO TABS: 80.0000 mg | ORAL_TABLET | Freq: Every day | ORAL | Status: DC

## 2012-06-02 MED ORDER — DICLOFENAC SODIUM 75 MG PO TBEC: 75.0000 mg | DELAYED_RELEASE_TABLET | Freq: Every day | ORAL | Status: DC

## 2012-06-02 NOTE — Patient Instructions (Addendum)
Please remember to followup with your GYN for the yearly pap smear and/or mammogram as you do You had the flu shot today Your EKG and labs were ok today Please continue your efforts at being more active, low cholesterol diet, and weight control, as you are doing Your refills were done today in hardcopy Your colonoscopy ill be due next in 2015 Please return in 1 year for your yearly visit, or sooner if needed, with Lab testing done 3-5 days before

## 2012-06-02 NOTE — Assessment & Plan Note (Signed)

## 2012-06-02 NOTE — Progress Notes (Signed)
Subjective:    Patient ID: [name], female    DOB: 1950/08/16, 61 y.o.   MRN: [id number]  HPI  Here for wellness and f/u;  Overall doing ok;  Pt denies CP, worsening SOB, DOE, wheezing, orthopnea, PND, worsening LE edema, palpitations, dizziness or syncope.  Pt denies neurological change such as new Headache, facial or extremity weakness.  Pt denies polydipsia, polyuria, or low sugar symptoms. Pt states overall good compliance with treatment and medications, good tolerability, and trying to follow lower cholesterol diet.  Pt denies worsening depressive symptoms, suicidal ideation or panic. No fever, wt loss, night sweats, loss of appetite, or other constitutional symptoms.  Pt states good ability with ADL's, low fall risk, home safety reviewed and adequate, no significant changes in hearing or vision, and has been trying to be more active with walking recently [place] walking [place].  Tries to walk 45 min per day. For flu shot today.  No acute complaints Past Medical History  Diagnosis Date  . ALLERGIC RHINITIS 04/04/2007  . BURSITIS, RIGHT HIP 04/04/2007  . COLONIC POLYPS, HX OF 04/04/2007  . FATIGUE 05/12/2007  . HYPERLIPIDEMIA 04/04/2007  . LATERAL EPICONDYLITIS, RIGHT 03/13/2010  . 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 OSTEOARTHRITIS, KNEE, RIGHT 02/12/2009  . OSTEOARTHRITIS 04/04/2007  . OSTEOPENIA 03/13/2010  . Palpitations 05/12/2007  . Unspecified vitamin D deficiency 02/12/2009   Past Surgical History  Procedure Date  . Hemorrhoidectomoy 1988  . Abdominal hysterectomy 2002  . Kidney stone removal 2005  . Oophorectomy 2002    reports that she has never smoked. She does not have any smokeless tobacco history on file. She reports that she drinks alcohol. She reports that she does not use illicit drugs. family history includes Coronary artery disease in her paternal grandfather. Allergies  Allergen Reactions  . Sulfonamide Derivatives    Current Outpatient Prescriptions on File  Prior to Visit  Medication Sig Dispense Refill  . aspirin 81 MG tablet Take 81 mg by mouth daily.        . calcium carbonate (OS-CAL) 600 MG TABS Take 600 mg by mouth daily.        . Cholecalciferol (VITAMIN D3) 1000 UNITS CAPS Take by mouth daily.        Marland Kitchen DISCONTD: atorvastatin (LIPITOR) 80 MG tablet Take 1 tablet (80 mg total) by mouth daily.  90 tablet  3   Review of Systems Review of Systems  Constitutional: Negative for diaphoresis, activity change, appetite change and unexpected weight change.  HENT: Negative for hearing loss, ear pain, facial swelling, mouth sores and neck stiffness.   Eyes: Negative for pain, redness and visual disturbance.  Respiratory: Negative for shortness of breath and wheezing.   Cardiovascular: Negative for chest pain and palpitations.  Gastrointestinal: Negative for diarrhea, blood in stool, abdominal distention and rectal pain.  Genitourinary: Negative for hematuria, flank pain and decreased urine volume.  Musculoskeletal: Negative for myalgias and joint swelling.  Skin: Negative for color change and wound.  Neurological: Negative for syncope and numbness.  Hematological: Negative for adenopathy.  Psychiatric/Behavioral: Negative for hallucinations, self-injury, decreased concentration and agitation.      Objective:   Physical Exam BP 110/70  Pulse 84  Temp 97.5 F (36.4 C) (Oral)  Ht 5\' 4"  (1.626 m)  Wt 169 lb 2 oz (76.715 kg)  BMI 29.03 kg/m2  SpO2 96% Physical Exam  VS noted Constitutional: Pt is oriented to person, place, and time. Appears well-developed and well-nourished. Lavella Lemons HENT:  Head: Normocephalic and atraumatic.  Right Ear: External ear normal.  Left Ear: External ear normal.  Nose: Nose normal.  Mouth/Throat: Oropharynx is clear and moist.  Eyes: Conjunctivae and EOM are normal. Pupils are equal, round, and reactive to light.  Neck: Normal range of motion. Neck supple. No JVD present. No tracheal deviation present.    Cardiovascular: Normal rate, regular rhythm, normal heart sounds and intact distal pulses.   Pulmonary/Chest: Effort normal and breath sounds normal.  Abdominal: Soft. Bowel sounds are normal. There is no tenderness.  Musculoskeletal: Normal range of motion. Exhibits no edema.  Lymphadenopathy:  Has no cervical adenopathy.  Neurological: Pt is alert and oriented to person, place, and time. Pt has normal reflexes. No cranial nerve deficit.  Skin: Skin is warm and dry. No rash noted.  Psychiatric:  Has  normal mood and affect. Behavior is normal.     Assessment & Plan:

## 2012-07-27 ENCOUNTER — Other Ambulatory Visit: Payer: Self-pay | Admitting: Internal Medicine

## 2012-07-29 ENCOUNTER — Other Ambulatory Visit: Payer: Self-pay | Admitting: *Deleted

## 2012-07-29 MED ORDER — ATORVASTATIN CALCIUM 80 MG PO TABS: 80.0000 mg | ORAL_TABLET | Freq: Every day | ORAL | Status: DC

## 2012-08-04 HISTORY — PX: COSMETIC SURGERY: SHX468

## 2013-01-19 ENCOUNTER — Telehealth: Payer: Self-pay

## 2013-01-19 MED ORDER — ATORVASTATIN CALCIUM 80 MG PO TABS: 80.0000 mg | ORAL_TABLET | Freq: Every day | ORAL | Status: DC

## 2013-01-19 MED ORDER — DICLOFENAC SODIUM 75 MG PO TBEC: 75.0000 mg | DELAYED_RELEASE_TABLET | Freq: Every day | ORAL | Status: DC

## 2013-01-19 NOTE — Telephone Encounter (Signed)
Pt called stating that she has changed insurance company and request written RX for  Diclofenac 75 mg and atorvastatin 80 mg. Would like a call when ready to pick up Thanks

## 2013-01-19 NOTE — Telephone Encounter (Signed)
Printed prescriptions requested and called the patient to pickup hardcopy's as requested.

## 2013-03-15 ENCOUNTER — Other Ambulatory Visit: Payer: Self-pay | Admitting: Internal Medicine

## 2013-03-15 DIAGNOSIS — Z1231 Encounter for screening mammogram for malignant neoplasm of breast: Secondary | ICD-10-CM

## 2013-03-17 ENCOUNTER — Telehealth: Payer: Self-pay | Admitting: *Deleted

## 2013-03-17 NOTE — Telephone Encounter (Signed)
Pt called requesting orders be sent to [facility] for Bone Density study.  Please advise

## 2013-03-17 NOTE — Telephone Encounter (Signed)
I cannot do this as I do not have priveleges at that hospital  I can order to be done at this office if she likes

## 2013-03-17 NOTE — Telephone Encounter (Signed)
Left message for pt to return call.

## 2013-03-18 ENCOUNTER — Telehealth: Payer: Self-pay | Admitting: *Deleted

## 2013-03-18 NOTE — Telephone Encounter (Signed)
Called the patient informed and to pickup at the front desk.

## 2013-03-18 NOTE — Telephone Encounter (Signed)
Pt states Dr Jonny Ruiz has written order for Bone Density to be done at [facility] in the past.  Please advise

## 2013-03-18 NOTE — Telephone Encounter (Signed)
That is when I had priveleges for the hospital system which I no longer have (I let it go to do outpatient work only) and before the current EMR  I can write a handwritten order, but not sure this will work  Secondary school teacher to [employer]

## 2013-03-23 ENCOUNTER — Other Ambulatory Visit: Payer: Self-pay | Admitting: Internal Medicine

## 2013-03-23 DIAGNOSIS — Z780 Asymptomatic menopausal state: Secondary | ICD-10-CM

## 2013-04-06 ENCOUNTER — Telehealth: Payer: Self-pay

## 2013-04-06 DIAGNOSIS — Z0000 Encounter for general adult medical examination without abnormal findings: Secondary | ICD-10-CM

## 2013-04-06 NOTE — Telephone Encounter (Signed)
CPX labs entered  

## 2013-04-22 ENCOUNTER — Ambulatory Visit (HOSPITAL_COMMUNITY)
Admission: RE | Admit: 2013-04-22 | Discharge: 2013-04-22 | Disposition: A | Discharge status: Home / Self Care | Payer: 59 | Source: Ambulatory Visit | Attending: Internal Medicine | Admitting: Internal Medicine

## 2013-04-22 ENCOUNTER — Telehealth: Payer: Self-pay | Admitting: Internal Medicine

## 2013-04-22 ENCOUNTER — Encounter: Payer: Self-pay | Admitting: Internal Medicine

## 2013-04-22 DIAGNOSIS — Z780 Asymptomatic menopausal state: Secondary | ICD-10-CM | POA: Insufficient documentation

## 2013-04-22 DIAGNOSIS — Z1231 Encounter for screening mammogram for malignant neoplasm of breast: Secondary | ICD-10-CM | POA: Insufficient documentation

## 2013-04-22 DIAGNOSIS — Z13820 Encounter for screening for osteoporosis: Secondary | ICD-10-CM | POA: Insufficient documentation

## 2013-04-22 MED ORDER — ALENDRONATE SODIUM 70 MG PO TABS: 70.0000 mg | ORAL_TABLET | ORAL | Status: DC

## 2013-04-22 NOTE — Telephone Encounter (Signed)
Fosamax resent to [facility] [address] [place]

## 2013-05-02 ENCOUNTER — Telehealth: Payer: Self-pay | Admitting: *Deleted

## 2013-05-02 NOTE — Telephone Encounter (Signed)
Spoke with pt advised of Mds message 

## 2013-05-02 NOTE — Telephone Encounter (Signed)
Fosamax should be taken alone, no other medications or food for at least one hour after ingestion. I would recommend holding off initiation of Fosamax until all planned dental procedures complete

## 2013-05-02 NOTE — Telephone Encounter (Signed)
Pt called requesting whether it is ok to take other meds with Fosamax.  Pt also requests whether it is ok to take just one dose of Fosamax then discontinue medication until after dental procedure.  Please advise in Dr Melvyn Novas absence

## 2013-06-09 ENCOUNTER — Other Ambulatory Visit (INDEPENDENT_AMBULATORY_CARE_PROVIDER_SITE_OTHER): Payer: BC Managed Care – PPO

## 2013-06-09 DIAGNOSIS — Z0000 Encounter for general adult medical examination without abnormal findings: Secondary | ICD-10-CM

## 2013-06-09 LAB — URINALYSIS, ROUTINE W REFLEX MICROSCOPIC
Specific Gravity, Urine: 1.025 (ref 1.000–1.030)
Total Protein, Urine: NEGATIVE
Urine Glucose: NEGATIVE
Urobilinogen, UA: 0.2 (ref 0.0–1.0)

## 2013-06-09 LAB — BASIC METABOLIC PANEL
CO2: 32 mEq/L (ref 19–32)
GFR: 100.59 mL/min (ref >60.00)
Glucose, Bld: 85 mg/dL (ref 70–99)
Potassium: 4.3 mEq/L (ref 3.5–5.1)
Sodium: 142 mEq/L (ref 135–145)

## 2013-06-09 LAB — LIPID PANEL
Cholesterol: 183 mg/dL (ref 0–200)
VLDL: 21.4 mg/dL (ref 0.0–40.0)

## 2013-06-09 LAB — CBC WITH DIFFERENTIAL/PLATELET
Eosinophils Relative: 1.1 % (ref 0.0–5.0)
Monocytes Absolute: 0.6 10*3/uL (ref 0.1–1.0)
Monocytes Relative: 10.7 % (ref 3.0–12.0)
Neutrophils Relative %: 70.9 % (ref 43.0–77.0)
Platelets: 214 10*3/uL (ref 150.0–400.0)
WBC: 5.2 10*3/uL (ref 4.5–10.5)

## 2013-06-09 LAB — HEPATIC FUNCTION PANEL
AST: 30 U/L (ref 0–37)
Albumin: 3.9 g/dL (ref 3.5–5.2)
Alkaline Phosphatase: 86 U/L (ref 39–117)

## 2013-06-17 ENCOUNTER — Ambulatory Visit (INDEPENDENT_AMBULATORY_CARE_PROVIDER_SITE_OTHER)
Admission: RE | Admit: 2013-06-17 | Discharge: 2013-06-17 | Disposition: A | Discharge status: Home / Self Care | Payer: 59 | Source: Ambulatory Visit | Attending: Internal Medicine | Admitting: Internal Medicine

## 2013-06-17 ENCOUNTER — Encounter: Payer: Self-pay | Admitting: Internal Medicine

## 2013-06-17 ENCOUNTER — Other Ambulatory Visit: Payer: 59

## 2013-06-17 ENCOUNTER — Ambulatory Visit (INDEPENDENT_AMBULATORY_CARE_PROVIDER_SITE_OTHER): Payer: Managed Care, Other (non HMO) | Admitting: Internal Medicine

## 2013-06-17 VITALS — BP 110/80 | HR 80 | Temp 98.9°F | Ht 64.0 in | Wt 178.2 lb

## 2013-06-17 DIAGNOSIS — M545 Low back pain: Secondary | ICD-10-CM

## 2013-06-17 DIAGNOSIS — M899 Disorder of bone, unspecified: Secondary | ICD-10-CM

## 2013-06-17 DIAGNOSIS — E559 Vitamin D deficiency, unspecified: Secondary | ICD-10-CM

## 2013-06-17 DIAGNOSIS — R079 Chest pain, unspecified: Secondary | ICD-10-CM | POA: Insufficient documentation

## 2013-06-17 DIAGNOSIS — Z0000 Encounter for general adult medical examination without abnormal findings: Secondary | ICD-10-CM

## 2013-06-17 DIAGNOSIS — M8580 Other specified disorders of bone density and structure, unspecified site: Secondary | ICD-10-CM | POA: Insufficient documentation

## 2013-06-17 DIAGNOSIS — R0789 Other chest pain: Secondary | ICD-10-CM | POA: Insufficient documentation

## 2013-06-17 MED ORDER — ASPIRIN 81 MG PO TABS: 81.0000 mg | ORAL_TABLET | Freq: Every day | ORAL | Status: DC

## 2013-06-17 NOTE — Patient Instructions (Addendum)
Your shingles shot can be done next yr, as well as the colonoscopy Please continue all other medications as before, and refills have been done if requested. Please have the pharmacy call with any other refills you may need. Please continue your efforts at being more active, low cholesterol diet, and weight control. You are otherwise up to date with prevention measures today. We can plan to continue the fosamax for 3 yrs Your EKG was ok today Your lab work was good today and you are given the copy  Please go to the XRAY Department in the Basement (go straight as you get off the elevator) for the x-ray testing Please go to the LAB in the Basement (turn left off the elevator) for the tests to be done today You will be contacted by phone if any changes need to be made immediately.  Otherwise, you will receive a letter about your results with an explanation, but please check with MyChart first.   Please remember to sign up for My Chart if you have not done so, as this will be important to you in the future with finding out test results, communicating by private email, and scheduling acute appointments online when needed.  Please return in 1 year for your yearly visit, or sooner if needed, with Lab testing done 3-5 days before

## 2013-06-17 NOTE — Assessment & Plan Note (Addendum)
ECG reviewed as per emr, ? MSK - for cxr per pt reqeust

## 2013-06-17 NOTE — Progress Notes (Signed)
Pre-visit discussion using our clinic review tool. No additional management support is needed unless otherwise documented below in the visit note.  

## 2013-06-17 NOTE — Progress Notes (Signed)
Subjective:    Patient ID: [name], female    DOB: [birth year], 62 y.o.   MRN: [id number]  HPI  Here for wellness and f/u;  Overall doing ok;  Pt denies worsening SOB, DOE, wheezing, orthopnea, PND, worsening LE edema, palpitations, dizziness or syncope, but has had intemittent right CP, sharp and dull, without radation or assoc symtpoms.  Pt denies neurological change such as new headache, facial or extremity weakness.  Pt denies polydipsia, polyuria, or low sugar symptoms. Pt states overall good compliance with treatment and medications, good tolerability, and has been trying to follow lower cholesterol diet.  Pt denies worsening depressive symptoms, suicidal ideation or panic. No fever, night sweats, wt loss, loss of appetite, or other constitutional symptoms.  Pt states good ability with ADL's, has low fall risk, home safety reviewed and adequate, no other significant changes in hearing or vision, and only occasionally active with exercise.  Pt continues to have recurring right LBP without change in severity, bowel or bladder change, fever, wt loss,  worsening LE pain/numbness/weakness, gait change or falls.  Also not with intermittent 3/10 lateral left hip pain for a few wks.  O/w tolerating fosamax .Requests referral to PT for "osteoporosis" evaluation Past Medical History  Diagnosis Date  . ALLERGIC RHINITIS 04/04/2007  . BURSITIS, RIGHT HIP 04/04/2007  . COLONIC POLYPS, HX OF 04/04/2007  . FATIGUE 05/12/2007  . HYPERLIPIDEMIA 04/04/2007  . LATERAL EPICONDYLITIS, RIGHT 03/13/2010  . 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 OSTEOARTHRITIS, KNEE, RIGHT 02/12/2009  . OSTEOARTHRITIS 04/04/2007  . OSTEOPENIA 03/13/2010  . Palpitations 05/12/2007  . Unspecified vitamin D deficiency 02/12/2009   Past Surgical History  Procedure Laterality Date  . Hemorrhoidectomoy  1988  . Abdominal hysterectomy  2002  . Kidney stone removal  2005  . Oophorectomy  2002    reports that she has never smoked. She does not have  any smokeless tobacco history on file. She reports that she drinks alcohol. She reports that she does not use illicit drugs. family history includes Coronary artery disease in her paternal grandfather. Allergies  Allergen Reactions  . Sulfonamide Derivatives    Current Outpatient Prescriptions on File Prior to Visit  Medication Sig Dispense Refill  . alendronate (FOSAMAX) 70 MG tablet Take 1 tablet (70 mg total) by mouth every 7 (seven) days. Take with a full glass of water on an empty stomach.  12 tablet  3  . atorvastatin (LIPITOR) 80 MG tablet Take 1 tablet (80 mg total) by mouth daily.  90 tablet  0  . calcium carbonate (OS-CAL) 600 MG TABS Take 600 mg by mouth daily.        . Cholecalciferol (VITAMIN D3) 1000 UNITS CAPS Take by mouth daily.         No current facility-administered medications on file prior to visit.   Review of Systems Constitutional: Negative for diaphoresis, activity change, appetite change or unexpected weight change.  HENT: Negative for hearing loss, ear pain, facial swelling, mouth sores and neck stiffness.   Eyes: Negative for pain, redness and visual disturbance.  Respiratory: Negative for shortness of breath and wheezing.   Cardiovascular: Negative for chest pain and palpitations.  Gastrointestinal: Negative for diarrhea, blood in stool, abdominal distention or other pain Genitourinary: Negative for hematuria, flank pain or change in urine volume.  Musculoskeletal: Negative for myalgias and joint swelling.  Skin: Negative for color change and wound.  Neurological: Negative for syncope and numbness. other than noted Hematological: Negative for adenopathy.  Psychiatric/Behavioral: Negative  for hallucinations, self-injury, decreased concentration and agitation.      Objective:   Physical Exam BP 110/80  Pulse 80  Temp(Src) 98.9 F (37.2 C) (Oral)  Ht 5\' 4"  (1.626 m)  Wt 178 lb 4 oz (80.854 kg)  BMI 30.58 kg/m2  SpO2 97% VS noted,  Constitutional: Pt  is oriented to person, place, and time. Appears well-developed and well-nourished.  Head: Normocephalic and atraumatic.  Right Ear: External ear normal.  Left Ear: External ear normal.  Nose: Nose normal.  Mouth/Throat: Oropharynx is clear and moist.  Eyes: Conjunctivae and EOM are normal. Pupils are equal, round, and reactive to light.  Neck: Normal range of motion. Neck supple. No JVD present. No tracheal deviation present.  Cardiovascular: Normal rate, regular rhythm, normal heart sounds and intact distal pulses.   Pulmonary/Chest: Effort normal and breath sounds normal.  Abdominal: Soft. Bowel sounds are normal. There is no tenderness. No HSM  Musculoskeletal: Normal range of motion. Exhibits no edema.  Lymphadenopathy:  Has no cervical adenopathy.  Neurological: Pt is alert and oriented to person, place, and time. Pt has normal reflexes. No cranial nerve deficit.  Skin: Skin is warm and dry. No rash noted.  Psychiatric:  Has  normal mood and affect. Behavior is normal.  Mild tender over left lateral hip/greater trochanter Spine nontender    Assessment & Plan:

## 2013-06-20 DIAGNOSIS — M545 Low back pain, unspecified: Secondary | ICD-10-CM | POA: Insufficient documentation

## 2013-06-20 NOTE — Assessment & Plan Note (Signed)
Right side, as well as prob mild left hip bursitis, for advil prn,  to f/u any worsening symptoms or concerns

## 2013-06-20 NOTE — Assessment & Plan Note (Signed)
For PT eval per pt reqeust

## 2013-06-20 NOTE — Assessment & Plan Note (Signed)

## 2013-06-20 NOTE — Assessment & Plan Note (Signed)
For Vit D per pt request

## 2013-08-04 HISTORY — PX: BLEPHAROPLASTY: SUR158

## 2013-08-08 ENCOUNTER — Other Ambulatory Visit: Payer: Self-pay | Admitting: Internal Medicine

## 2013-08-09 ENCOUNTER — Encounter: Payer: Self-pay | Admitting: Internal Medicine

## 2013-08-09 NOTE — Telephone Encounter (Signed)
Robin/staff to see above

## 2013-08-12 NOTE — Telephone Encounter (Signed)
Printed bone density 04/22/13 & mail to pt address...lmb

## 2014-05-05 ENCOUNTER — Other Ambulatory Visit: Payer: Self-pay | Admitting: Internal Medicine

## 2014-05-05 DIAGNOSIS — Z1231 Encounter for screening mammogram for malignant neoplasm of breast: Secondary | ICD-10-CM

## 2014-05-11 ENCOUNTER — Ambulatory Visit (HOSPITAL_COMMUNITY)
Admission: RE | Admit: 2014-05-11 | Discharge: 2014-05-11 | Disposition: A | Discharge status: Home / Self Care | Payer: 59 | Source: Ambulatory Visit | Attending: Internal Medicine | Admitting: Internal Medicine

## 2014-05-11 DIAGNOSIS — Z1231 Encounter for screening mammogram for malignant neoplasm of breast: Secondary | ICD-10-CM | POA: Insufficient documentation

## 2014-05-11 LAB — HM MAMMOGRAPHY

## 2014-05-19 ENCOUNTER — Ambulatory Visit (INDEPENDENT_AMBULATORY_CARE_PROVIDER_SITE_OTHER): Payer: 59

## 2014-05-19 ENCOUNTER — Other Ambulatory Visit: Payer: Self-pay

## 2014-05-19 DIAGNOSIS — Z23 Encounter for immunization: Secondary | ICD-10-CM

## 2014-05-22 ENCOUNTER — Telehealth: Payer: Self-pay | Admitting: Internal Medicine

## 2014-05-22 NOTE — Telephone Encounter (Signed)
Pt requests yearly CPE Dec 2015, provider has no slots until Feb 2016. Please advise.

## 2014-05-23 NOTE — Telephone Encounter (Signed)
That is fine to schedule cpx in December. You can add 2 15 mins slot to make cpx appt...[name]

## 2014-06-20 ENCOUNTER — Telehealth: Payer: Self-pay | Admitting: Internal Medicine

## 2014-06-20 NOTE — Telephone Encounter (Signed)
Rec'd from [facility] forward 2 pages to Dr. Jenny Reichmann

## 2014-07-04 ENCOUNTER — Other Ambulatory Visit (INDEPENDENT_AMBULATORY_CARE_PROVIDER_SITE_OTHER): Payer: 59

## 2014-07-04 DIAGNOSIS — Z0000 Encounter for general adult medical examination without abnormal findings: Secondary | ICD-10-CM

## 2014-07-04 LAB — URINALYSIS, ROUTINE W REFLEX MICROSCOPIC
Bilirubin Urine: NEGATIVE
Hgb urine dipstick: NEGATIVE
Ketones, ur: NEGATIVE
LEUKOCYTES UA: NEGATIVE
NITRITE: NEGATIVE
PH: 8 (ref 5.0–8.0)
SPECIFIC GRAVITY, URINE: 1.01 (ref 1.000–1.030)
Total Protein, Urine: NEGATIVE
UROBILINOGEN UA: 0.2 (ref 0.0–1.0)
Urine Glucose: NEGATIVE

## 2014-07-04 LAB — HEPATIC FUNCTION PANEL
ALBUMIN: 3.9 g/dL (ref 3.5–5.2)
ALT: 27 U/L (ref 0–35)
AST: 27 U/L (ref 0–37)
Alkaline Phosphatase: 86 U/L (ref 39–117)
BILIRUBIN DIRECT: 0 mg/dL (ref 0.0–0.3)
TOTAL PROTEIN: 7.1 g/dL (ref 6.0–8.3)
Total Bilirubin: 0.6 mg/dL (ref 0.2–1.2)

## 2014-07-04 LAB — CBC WITH DIFFERENTIAL/PLATELET
BASOS PCT: 0.3 % (ref 0.0–3.0)
Basophils Absolute: 0 10*3/uL (ref 0.0–0.1)
EOS ABS: 0.1 10*3/uL (ref 0.0–0.7)
Eosinophils Relative: 1.9 % (ref 0.0–5.0)
HCT: 41.3 % (ref 36.0–46.0)
Hemoglobin: 13.8 g/dL (ref 12.0–15.0)
LYMPHS PCT: 14.7 % (ref 12.0–46.0)
Lymphs Abs: 0.9 10*3/uL (ref 0.7–4.0)
MCHC: 33.4 g/dL (ref 30.0–36.0)
MCV: 87 fl (ref 78.0–100.0)
MONO ABS: 0.5 10*3/uL (ref 0.1–1.0)
Monocytes Relative: 8.3 % (ref 3.0–12.0)
NEUTROS PCT: 74.8 % (ref 43.0–77.0)
Neutro Abs: 4.7 10*3/uL (ref 1.4–7.7)
Platelets: 221 10*3/uL (ref 150.0–400.0)
RBC: 4.74 Mil/uL (ref 3.87–5.11)
RDW: 13.1 % (ref 11.5–15.5)
WBC: 6.3 10*3/uL (ref 4.0–10.5)

## 2014-07-04 LAB — LIPID PANEL
CHOL/HDL RATIO: 4
CHOLESTEROL: 191 mg/dL (ref 0–200)
HDL: 48.8 mg/dL (ref >39.00)
LDL Cholesterol: 110 mg/dL — ABNORMAL HIGH (ref 0–99)
NonHDL: 142.2
TRIGLYCERIDES: 163 mg/dL — AB (ref 0.0–149.0)
VLDL: 32.6 mg/dL (ref 0.0–40.0)

## 2014-07-04 LAB — BASIC METABOLIC PANEL
BUN: 12 mg/dL (ref 6–23)
CO2: 28 mEq/L (ref 19–32)
Calcium: 9.7 mg/dL (ref 8.4–10.5)
Chloride: 104 mEq/L (ref 96–112)
Creatinine, Ser: 0.6 mg/dL (ref 0.4–1.2)
GFR: 102.07 mL/min (ref >60.00)
Glucose, Bld: 86 mg/dL (ref 70–99)
POTASSIUM: 4.3 meq/L (ref 3.5–5.1)
Sodium: 140 mEq/L (ref 135–145)

## 2014-07-04 LAB — TSH: TSH: 2.54 u[IU]/mL (ref 0.35–4.50)

## 2014-07-11 ENCOUNTER — Encounter: Payer: Self-pay | Admitting: Internal Medicine

## 2014-07-11 ENCOUNTER — Ambulatory Visit (INDEPENDENT_AMBULATORY_CARE_PROVIDER_SITE_OTHER): Payer: 59 | Admitting: Internal Medicine

## 2014-07-11 VITALS — BP 120/88 | HR 92 | Temp 98.1°F | Ht 64.0 in | Wt 180.0 lb

## 2014-07-11 DIAGNOSIS — Z0000 Encounter for general adult medical examination without abnormal findings: Secondary | ICD-10-CM

## 2014-07-11 DIAGNOSIS — Z23 Encounter for immunization: Secondary | ICD-10-CM

## 2014-07-11 MED ORDER — ATORVASTATIN CALCIUM 80 MG PO TABS: 80.0000 mg | ORAL_TABLET | Freq: Every day | ORAL | Status: DC

## 2014-07-11 NOTE — Patient Instructions (Addendum)
You had the shingles shot today  Please continue all other medications as before, and refills have been done if requested.  Please have the pharmacy call with any other refills you may need.  Please continue your efforts at being more active, low cholesterol diet, and weight control.  You are otherwise up to date with prevention measures today.  Please keep your appointments with your specialists as you may have planned  Your lab work was [facility] today  You will be contacted regarding the referral for: colonoscopy  Please return in 1 year for your yearly visit, or sooner if needed, with Lab testing done 3-5 days before

## 2014-07-11 NOTE — Assessment & Plan Note (Signed)

## 2014-07-11 NOTE — Progress Notes (Signed)
Pre visit review using our clinic review tool, if applicable. No additional management support is needed unless otherwise documented below in the visit note. 

## 2014-07-11 NOTE — Progress Notes (Signed)
Subjective:    Patient ID: [name], female    DOB: [birth year], 63 y.o.   MRN: [id number]  HPI  Here for wellness and f/u;  Overall doing ok;  Pt denies CP, worsening SOB, DOE, wheezing, orthopnea, PND, worsening LE edema, palpitations, dizziness or syncope.  Pt denies neurological change such as new headache, facial or extremity weakness.  Pt denies polydipsia, polyuria, or low sugar symptoms. Pt states overall good compliance with treatment and medications, good tolerability, and has been trying to follow lower cholesterol diet.  Pt denies worsening depressive symptoms, suicidal ideation or panic. No fever, night sweats, wt loss, loss of appetite, or other constitutional symptoms.  Pt states good ability with ADL's, has low fall risk, home safety reviewed and adequate, no other significant changes in hearing or vision, and occas active with exercise, with cardio 3-4 times per wk.  Good compliance with lipitor, concerned about developing DM, keeps gaining a few lbs every year.   Wt Readings from Last 3 Encounters:  07/11/14 180 lb (81.647 kg)  06/17/13 178 lb 4 oz (80.854 kg)  06/02/12 169 lb 2 oz (76.715 kg)  Has some thinning hair, occas bruise to legs.  Not taking the fosamax since April has ongoing mult oral/dental surguries Past Medical History  Diagnosis Date  . ALLERGIC RHINITIS 04/04/2007  . BURSITIS, RIGHT HIP 04/04/2007  . COLONIC POLYPS, HX OF 04/04/2007  . FATIGUE 05/12/2007  . HYPERLIPIDEMIA 04/04/2007  . LATERAL EPICONDYLITIS, RIGHT 03/13/2010  . 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 OSTEOARTHRITIS, KNEE, RIGHT 02/12/2009  . OSTEOARTHRITIS 04/04/2007  . OSTEOPENIA 03/13/2010  . Palpitations 05/12/2007  . Unspecified vitamin D deficiency 02/12/2009   Past Surgical History  Procedure Laterality Date  . Hemorrhoidectomoy  1988  . Abdominal hysterectomy  2002  . Kidney stone removal  2005  . Oophorectomy  2002    reports that she has never smoked. She does not have any smokeless tobacco  history on file. She reports that she drinks alcohol. She reports that she does not use illicit drugs. family history includes Gastric cancer in her paternal grandfather; Heart disease in her father and mother. Allergies  Allergen Reactions  . Sulfonamide Derivatives    Current Outpatient Prescriptions on File Prior to Visit  Medication Sig Dispense Refill  . aspirin 81 MG tablet Take 1 tablet (81 mg total) by mouth daily. 30 tablet 11  . atorvastatin (LIPITOR) 80 MG tablet TAKE 1 TABLET EVERY DAY 90 tablet 3  . calcium carbonate (OS-CAL) 600 MG TABS Take 600 mg by mouth daily.      . Cholecalciferol (VITAMIN D3) 1000 UNITS CAPS Take by mouth daily.       No current facility-administered medications on file prior to visit.   Review of Systems Constitutional: Negative for increased diaphoresis, other activity, appetite or other siginficant weight change  HENT: Negative for worsening hearing loss, ear pain, facial swelling, mouth sores and neck stiffness.   Eyes: Negative for other worsening pain, redness or visual disturbance.  Respiratory: Negative for shortness of breath and wheezing.   Cardiovascular: Negative for chest pain and palpitations.  Gastrointestinal: Negative for diarrhea, blood in stool, abdominal distention or other pain Genitourinary: Negative for hematuria, flank pain or change in urine volume.  Musculoskeletal: Negative for myalgias or other joint complaints.  Skin: Negative for color change and wound.  Neurological: Negative for syncope and numbness. other than noted Hematological: Negative for adenopathy. or other swelling Psychiatric/Behavioral: Negative for hallucinations, self-injury, decreased concentration or  other worsening agitation.      Objective:   Physical Exam BP 120/88 mmHg  Pulse 92  Temp(Src) 98.1 F (36.7 C) (Oral)  Ht 5\' 4"  (1.626 m)  Wt 180 lb (81.647 kg)  BMI 30.88 kg/m2  SpO2 96% VS noted,  Constitutional: Pt is oriented to person,  place, and time. Appears well-developed and well-nourished.  Head: Normocephalic and atraumatic.  Right Ear: External ear normal.  Left Ear: External ear normal.  Nose: Nose normal.  Mouth/Throat: Oropharynx is clear and moist.  Eyes: Conjunctivae and EOM are normal. Pupils are equal, round, and reactive to light.  Neck: Normal range of motion. Neck supple. No JVD present. No tracheal deviation present.  Cardiovascular: Normal rate, regular rhythm, normal heart sounds and intact distal pulses.   Pulmonary/Chest: Effort normal and breath sounds without rales or wheezing  Abdominal: Soft. Bowel sounds are normal. NT. No HSM  Musculoskeletal: Normal range of motion. Exhibits no edema.  Lymphadenopathy:  Has no cervical adenopathy.  Neurological: Pt is alert and oriented to person, place, and time. Pt has normal reflexes. No cranial nerve deficit. Motor grossly intact Skin: Skin is warm and dry. No rash noted.  Psychiatric:  Has mild nervous mood and affect. Behavior is normal.     Assessment & Plan:

## 2014-07-11 NOTE — Addendum Note (Signed)
Addended by: Julieta Bellini on: 07/11/2014 03:36 PM   Modules accepted: Orders, Medications, SmartSet

## 2014-07-12 ENCOUNTER — Encounter: Payer: Self-pay | Admitting: Internal Medicine

## 2014-07-19 ENCOUNTER — Encounter: Payer: Self-pay | Admitting: Internal Medicine

## 2014-09-14 ENCOUNTER — Ambulatory Visit (AMBULATORY_SURGERY_CENTER): Payer: Self-pay | Admitting: *Deleted

## 2014-09-14 VITALS — Ht 64.0 in | Wt 175.8 lb

## 2014-09-14 DIAGNOSIS — Z86010 Personal history of colonic polyps: Secondary | ICD-10-CM

## 2014-09-14 NOTE — Progress Notes (Signed)
No allergies to eggs or soy. No problems with anesthesia.  Pt given Emmi instructions for colonoscopy  No oxygen use  No diet drug use  

## 2014-09-29 ENCOUNTER — Ambulatory Visit (AMBULATORY_SURGERY_CENTER): Payer: 59 | Admitting: Internal Medicine

## 2014-09-29 ENCOUNTER — Encounter: Payer: Self-pay | Admitting: Internal Medicine

## 2014-09-29 VITALS — BP 114/38 | HR 61 | Temp 98.2°F | Resp 21 | Ht 64.0 in | Wt 175.0 lb

## 2014-09-29 DIAGNOSIS — D124 Benign neoplasm of descending colon: Secondary | ICD-10-CM

## 2014-09-29 DIAGNOSIS — Z86010 Personal history of colonic polyps: Secondary | ICD-10-CM

## 2014-09-29 HISTORY — PX: COLONOSCOPY: SHX174

## 2014-09-29 MED ORDER — SODIUM CHLORIDE 0.9 % IV SOLN: 500.0000 mL | INTRAVENOUS | Status: DC

## 2014-09-29 NOTE — Op Note (Signed)
[place]  [address] [place], [address]   COLONOSCOPY PROCEDURE REPORT  PATIENT: [name], [name]  MR#: [id number] BIRTHDATE: 10-Aug-1950 , 63  yrs. old GENDER: female ENDOSCOPIST: Gatha Mayer, MD, [facility] PROCEDURE DATE:  09/29/2014 PROCEDURE:   Colonoscopy with snare polypectomy First Screening Colonoscopy - Avg.  risk and is 50 yrs.  old or older - No.  Prior Negative Screening - Now for repeat screening. N/A  History of Adenoma - Now for follow-up colonoscopy & has been > or = to 3 yrs.  History of Adenoma - Now for follow-up colonoscopy & has been > or = to 3 yrs.  N/A  Polyps Removed Today? Yes. ASA CLASS:   Class II INDICATIONS:high risk patient with personal history of colonic polyps. MEDICATIONS: Propofol 230 mg IV and Monitored anesthesia care  DESCRIPTION OF PROCEDURE:   After the risks benefits and alternatives of the procedure were thoroughly explained, informed consent was obtained.  The digital rectal exam revealed no abnormalities of the rectum.   The LB [id number]  endoscope was introduced through the anus and advanced to the cecum, which was identified by both the appendix and ileocecal valve. No adverse events experienced.   The quality of the prep was excellent, using MiraLax  The instrument was then slowly withdrawn as the colon was fully examined.      COLON FINDINGS: 1) 7 mm flat polyp completely removed from descending colon by cold snare and sent to pathology. 2) Otherwise normal colonoscopy with excellent prep.  Retroflexed views revealed no abnormalities. The time to cecum=2 minutes 35 seconds.  Withdrawal time=10 minutes 33 seconds.  The scope was withdrawn and the procedure completed. COMPLICATIONS: There were no immediate complications.  ENDOSCOPIC IMPRESSION: 1) 7 mm flat polyp completely removed from descending colon by cold snare and sent to pathology. 2) Otherwise normal colonoscopy with excellent prep - hx  diminutive rectal polyp removed 2005 (no pathology available)  RECOMMENDATIONS: Timing of repeat colonoscopy will be determined by pathology findings.  eSigned:  Gatha Mayer, MD, [facility] 09/29/2014 12:13 PM   cc: Cathlean Cower, MD and The Patient

## 2014-09-29 NOTE — Progress Notes (Signed)
Called to room to assist during endoscopic procedure.  Patient ID and intended procedure confirmed with present staff. Received instructions for my participation in the procedure from the performing physician.  

## 2014-09-29 NOTE — Patient Instructions (Addendum)
I found and removed one small polyp that looks benign. I will let you know pathology results and when to have another routine colonoscopy by mail.  I appreciate the opportunity to care for you. Gatha Mayer, MD, FACG   YOU HAD AN ENDOSCOPIC PROCEDURE TODAY AT [facility]: Refer to the procedure report that was given to you for any specific questions about what was found during the examination.  If the procedure report does not answer your questions, please call your gastroenterologist to clarify.  If you requested that your care partner not be given the details of your procedure findings, then the procedure report has been included in a sealed envelope for you to review at your convenience later.  YOU SHOULD EXPECT: Some feelings of bloating in the abdomen. Passage of more gas than usual.  Walking can help get rid of the air that was put into your GI tract during the procedure and reduce the bloating. If you had a lower endoscopy (such as a colonoscopy or flexible sigmoidoscopy) you may notice spotting of blood in your stool or on the toilet paper. If you underwent a bowel prep for your procedure, then you may not have a normal bowel movement for a few days.  DIET: Your first meal following the procedure should be a light meal and then it is ok to progress to your normal diet.  A half-sandwich or bowl of soup is an example of a good first meal.  Heavy or fried foods are harder to digest and may make you feel nauseous or bloated.  Likewise meals heavy in dairy and vegetables can cause extra gas to form and this can also increase the bloating.  Drink plenty of fluids but you should avoid alcoholic beverages for 24 hours.  ACTIVITY: Your care partner should take you home directly after the procedure.  You should plan to take it easy, moving slowly for the rest of the day.  You can resume normal activity the day after the procedure however you should NOT DRIVE or use heavy  machinery for 24 hours (because of the sedation medicines used during the test).    SYMPTOMS TO REPORT IMMEDIATELY: A gastroenterologist can be reached at any hour.  During normal business hours, 8:30 AM to 5:00 PM Monday through Friday, call [phone].  After hours and on weekends, please call the GI answering service at [phone] who will take a message and have the physician on call contact you.   Following lower endoscopy (colonoscopy or flexible sigmoidoscopy):  Excessive amounts of blood in the stool  Significant tenderness or worsening of abdominal pains  Swelling of the abdomen that is new, acute  Fever of 100F or higher  FOLLOW UP: If any biopsies were taken you will be contacted by phone or by letter within the next 1-3 weeks.  Call your gastroenterologist if you have not heard about the biopsies in 3 weeks.  Our staff will call the home number listed on your records the next business day following your procedure to check on you and address any questions or concerns that you may have at that time regarding the information given to you following your procedure. This is a courtesy call and so if there is no answer at the home number and we have not heard from you through the emergency physician on call, we will assume that you have returned to your regular daily activities without incident.  SIGNATURES/CONFIDENTIALITY: You and/or your care partner  have signed paperwork which will be entered into your electronic medical record.  These signatures attest to the fact that that the information above on your After Visit Summary has been reviewed and is understood.  Full responsibility of the confidentiality of this discharge information lies with you and/or your care-partner.  Recommendations Discharge instructions given to patient and/or care partner. Polyp handout provided.

## 2014-09-29 NOTE — Progress Notes (Signed)
Report to PACU, RN, vss, BBS= Clear.  

## 2014-10-02 ENCOUNTER — Telehealth: Payer: Self-pay | Admitting: *Deleted

## 2014-10-02 NOTE — Telephone Encounter (Signed)
  Follow up Call-  Call back number 09/29/2014  Post procedure Call Back phone  # [phone]  Permission to leave phone message Yes     Patient questions:  Do you have a fever, pain , or abdominal swelling? No. Pain Score  0 *  Have you tolerated food without any problems? Yes.    Have you been able to return to your normal activities? No.  Do you have any questions about your discharge instructions: Diet   No. Medications  No. Follow up visit  No.  Do you have questions or concerns about your Care? No.  Actions: * If pain score is 4 or above: No action needed, pain <4.

## 2014-10-04 ENCOUNTER — Encounter: Payer: Self-pay | Admitting: Internal Medicine

## 2014-10-04 DIAGNOSIS — Z86010 Personal history of colonic polyps: Secondary | ICD-10-CM

## 2014-10-04 NOTE — Progress Notes (Signed)
Quick Note:  7 mm sessile serrated polyp - repeat colonoscopy 2021 ______

## 2015-04-25 ENCOUNTER — Other Ambulatory Visit: Payer: Self-pay

## 2015-04-25 ENCOUNTER — Telehealth: Payer: Self-pay | Admitting: Internal Medicine

## 2015-04-25 DIAGNOSIS — Z1231 Encounter for screening mammogram for malignant neoplasm of breast: Secondary | ICD-10-CM

## 2015-04-25 NOTE — Telephone Encounter (Signed)
Order Done hardcopy to [employer]

## 2015-04-25 NOTE — Telephone Encounter (Signed)
Pt is due for her bone density and she has it done at [facility] and in the past Dr. Jenny Reichmann would give her a prescription and she would take it with her.  She is wondering if she needs to do that this time? Please advise

## 2015-04-25 NOTE — Telephone Encounter (Signed)
Order placed in cabinet for pt pick up. Pt advised

## 2015-04-27 ENCOUNTER — Ambulatory Visit (INDEPENDENT_AMBULATORY_CARE_PROVIDER_SITE_OTHER): Payer: 59

## 2015-04-27 DIAGNOSIS — Z23 Encounter for immunization: Secondary | ICD-10-CM | POA: Diagnosis not present

## 2015-05-04 ENCOUNTER — Other Ambulatory Visit: Payer: Self-pay | Admitting: Internal Medicine

## 2015-05-04 DIAGNOSIS — M8580 Other specified disorders of bone density and structure, unspecified site: Secondary | ICD-10-CM

## 2015-05-21 ENCOUNTER — Ambulatory Visit: Payer: 59

## 2015-05-23 ENCOUNTER — Ambulatory Visit
Admission: RE | Admit: 2015-05-23 | Discharge: 2015-05-23 | Disposition: A | Discharge status: Home / Self Care | Payer: 59 | Source: Ambulatory Visit

## 2015-05-23 DIAGNOSIS — Z1231 Encounter for screening mammogram for malignant neoplasm of breast: Secondary | ICD-10-CM

## 2015-06-06 ENCOUNTER — Ambulatory Visit
Admission: RE | Admit: 2015-06-06 | Discharge: 2015-06-06 | Disposition: A | Discharge status: Home / Self Care | Payer: 59 | Source: Ambulatory Visit | Attending: Internal Medicine | Admitting: Internal Medicine

## 2015-06-06 DIAGNOSIS — M8580 Other specified disorders of bone density and structure, unspecified site: Secondary | ICD-10-CM

## 2015-07-17 ENCOUNTER — Encounter: Payer: 59 | Admitting: Internal Medicine

## 2015-07-18 ENCOUNTER — Other Ambulatory Visit (INDEPENDENT_AMBULATORY_CARE_PROVIDER_SITE_OTHER): Payer: 59

## 2015-07-18 DIAGNOSIS — Z0000 Encounter for general adult medical examination without abnormal findings: Secondary | ICD-10-CM

## 2015-07-18 LAB — LIPID PANEL
CHOLESTEROL: 184 mg/dL (ref 0–200)
HDL: 61.8 mg/dL (ref >39.00)
LDL Cholesterol: 98 mg/dL (ref 0–99)
NONHDL: 121.92
Total CHOL/HDL Ratio: 3
Triglycerides: 120 mg/dL (ref 0.0–149.0)
VLDL: 24 mg/dL (ref 0.0–40.0)

## 2015-07-18 LAB — BASIC METABOLIC PANEL
BUN: 11 mg/dL (ref 6–23)
CALCIUM: 9.3 mg/dL (ref 8.4–10.5)
CHLORIDE: 106 meq/L (ref 96–112)
CO2: 28 meq/L (ref 19–32)
Creatinine, Ser: 0.59 mg/dL (ref 0.40–1.20)
GFR: 109.72 mL/min (ref >60.00)
GLUCOSE: 99 mg/dL (ref 70–99)
POTASSIUM: 4.4 meq/L (ref 3.5–5.1)
SODIUM: 140 meq/L (ref 135–145)

## 2015-07-18 LAB — URINALYSIS, ROUTINE W REFLEX MICROSCOPIC
Bilirubin Urine: NEGATIVE
Hgb urine dipstick: NEGATIVE
Ketones, ur: NEGATIVE
Leukocytes, UA: NEGATIVE
Nitrite: NEGATIVE
PH: 7 (ref 5.0–8.0)
RBC / HPF: NONE SEEN (ref 0–?)
SPECIFIC GRAVITY, URINE: 1.02 (ref 1.000–1.030)
TOTAL PROTEIN, URINE-UPE24: NEGATIVE
UROBILINOGEN UA: 0.2 (ref 0.0–1.0)
Urine Glucose: NEGATIVE
WBC, UA: NONE SEEN (ref 0–?)

## 2015-07-18 LAB — CBC WITH DIFFERENTIAL/PLATELET
BASOS ABS: 0 10*3/uL (ref 0.0–0.1)
Basophils Relative: 0.3 % (ref 0.0–3.0)
Eosinophils Absolute: 0.1 10*3/uL (ref 0.0–0.7)
Eosinophils Relative: 1.5 % (ref 0.0–5.0)
HCT: 40.8 % (ref 36.0–46.0)
Hemoglobin: 13.7 g/dL (ref 12.0–15.0)
LYMPHS ABS: 1 10*3/uL (ref 0.7–4.0)
Lymphocytes Relative: 16.8 % (ref 12.0–46.0)
MCHC: 33.6 g/dL (ref 30.0–36.0)
MCV: 84.8 fl (ref 78.0–100.0)
MONO ABS: 0.6 10*3/uL (ref 0.1–1.0)
MONOS PCT: 10.5 % (ref 3.0–12.0)
NEUTROS ABS: 4.3 10*3/uL (ref 1.4–7.7)
NEUTROS PCT: 70.9 % (ref 43.0–77.0)
PLATELETS: 201 10*3/uL (ref 150.0–400.0)
RBC: 4.82 Mil/uL (ref 3.87–5.11)
RDW: 13.3 % (ref 11.5–15.5)
WBC: 6 10*3/uL (ref 4.0–10.5)

## 2015-07-18 LAB — HEPATIC FUNCTION PANEL
ALBUMIN: 4 g/dL (ref 3.5–5.2)
ALK PHOS: 92 U/L (ref 39–117)
ALT: 18 U/L (ref 0–35)
AST: 18 U/L (ref 0–37)
BILIRUBIN DIRECT: 0.1 mg/dL (ref 0.0–0.3)
TOTAL PROTEIN: 7 g/dL (ref 6.0–8.3)
Total Bilirubin: 0.6 mg/dL (ref 0.2–1.2)

## 2015-07-18 LAB — TSH: TSH: 2.62 u[IU]/mL (ref 0.35–4.50)

## 2015-07-25 ENCOUNTER — Encounter: Payer: Self-pay | Admitting: Internal Medicine

## 2015-07-25 ENCOUNTER — Ambulatory Visit (INDEPENDENT_AMBULATORY_CARE_PROVIDER_SITE_OTHER): Payer: 59 | Admitting: Internal Medicine

## 2015-07-25 ENCOUNTER — Other Ambulatory Visit: Payer: 59

## 2015-07-25 ENCOUNTER — Other Ambulatory Visit: Payer: Self-pay

## 2015-07-25 VITALS — BP 118/86 | HR 85 | Temp 98.0°F | Ht 64.0 in | Wt 188.0 lb

## 2015-07-25 DIAGNOSIS — Z0000 Encounter for general adult medical examination without abnormal findings: Secondary | ICD-10-CM

## 2015-07-25 MED ORDER — ATORVASTATIN CALCIUM 80 MG PO TABS: 80.0000 mg | ORAL_TABLET | Freq: Every day | ORAL | Status: DC

## 2015-07-25 NOTE — Progress Notes (Signed)
Pre visit review using our clinic review tool, if applicable. No additional management support is needed unless otherwise documented below in the visit note. 

## 2015-07-25 NOTE — Assessment & Plan Note (Signed)

## 2015-07-25 NOTE — Patient Instructions (Signed)
Your EKG was OK today  Please continue all other medications as before, and refills have been done if requested.  Please have the pharmacy call with any other refills you may need.  Please continue your efforts at being more active, low cholesterol diet, and weight control.  You are otherwise up to date with prevention measures today.  Please keep your appointments with your specialists as you may have planned  You will be contacted regarding the referral for: GYN  Please go to the LAB in the Basement (turn left off the elevator) for the tests to be done today  - just the hep C test  You will be contacted by phone if any changes need to be made immediately.  Otherwise, you will receive a letter about your results with an explanation, but please check with MyChart first.  Please remember to sign up for MyChart if you have not done so, as this will be important to you in the future with finding out test results, communicating by private email, and scheduling acute appointments online when needed.  Please return in 1 year for your yearly visit, or sooner if needed, with Lab testing done 3-5 days before

## 2015-07-25 NOTE — Progress Notes (Signed)
Subjective:    Patient ID: [name], female    DOB: 02/21/1951, 64 y.o.   MRN: [id number]  HPI  Here for wellness and f/u;  Overall doing ok;  Pt denies Chest pain, worsening SOB, DOE, wheezing, orthopnea, PND, worsening LE edema, palpitations, dizziness or syncope.  Pt denies neurological change such as new headache, facial or extremity weakness.  Pt denies polydipsia, polyuria, or low sugar symptoms. Pt states overall good compliance with treatment and medications, good tolerability, and has been trying to follow appropriate diet.  Pt denies worsening depressive symptoms, suicidal ideation or panic. No fever, night sweats, wt loss, loss of appetite, or other constitutional symptoms.  Pt states good ability with ADL's, has low fall risk, home safety reviewed and adequate, no other significant changes in hearing or vision, and only occasionally active with exercise.  No current complaints Past Medical History  Diagnosis Date  . ALLERGIC RHINITIS 04/04/2007  . BURSITIS, RIGHT HIP 04/04/2007  . COLONIC POLYPS, HX OF 04/04/2007  . FATIGUE 05/12/2007  . HYPERLIPIDEMIA 04/04/2007  . LATERAL EPICONDYLITIS, RIGHT 03/13/2010  . 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 OSTEOARTHRITIS, KNEE, RIGHT 02/12/2009  . OSTEOARTHRITIS 04/04/2007  . OSTEOPENIA 03/13/2010  . Palpitations 05/12/2007  . Unspecified vitamin D deficiency 02/12/2009   Past Surgical History  Procedure Laterality Date  . Hemorrhoidectomoy  1988  . Abdominal hysterectomy  2002  . Kidney stone removal  2005  . Oophorectomy  2002  . Blepharoplasty Bilateral 2015  . Colonoscopy      reports that she quit smoking about 41 years ago. She has never used smokeless tobacco. She reports that she drinks about 3.0 oz of alcohol per week. She reports that she does not use illicit drugs. family history includes Gastric cancer in her paternal grandfather; Heart disease in her father and mother. There is no history of Colon cancer. Allergies  Allergen Reactions    . Sulfonamide Derivatives Swelling   Current Outpatient Prescriptions on File Prior to Visit  Medication Sig Dispense Refill  . aspirin 81 MG tablet Take 1 tablet (81 mg total) by mouth daily. 30 tablet 11  . Biotin w/ Vitamins C & E 1250-7.5-7.5 MCG-MG-UNT CHEW Chew 12 mg by mouth daily.    . Calcium-Magnesium-Vitamin D (CITRACAL CALCIUM+D) 600-40-500 MG-MG-UNIT TB24 Take 600 mg by mouth 2 (two) times daily.    . Cholecalciferol (VITAMIN D3) 1000 UNITS CAPS Take by mouth daily.      . Magnesium Oxide 250 MG TABS Take 250 mg by mouth daily.    . chlorhexidine (PERIDEX) 0.12 % solution Use as directed 15 mLs in the mouth or throat 2 (two) times daily. Reported on 07/25/2015    . Doxycycline Hyclate 20 MG CAPS Take 20 mg by mouth. Reported on 07/25/2015     No current facility-administered medications on file prior to visit.   Review of Systems Constitutional: Negative for increased diaphoresis, other activity, appetite or siginficant weight change other than noted HENT: Negative for worsening hearing loss, ear pain, facial swelling, mouth sores and neck stiffness.   Eyes: Negative for other worsening pain, redness or visual disturbance.  Respiratory: Negative for shortness of breath and wheezing  Cardiovascular: Negative for chest pain and palpitations.  Gastrointestinal: Negative for diarrhea, blood in stool, abdominal distention or other pain Genitourinary: Negative for hematuria, flank pain or change in urine volume.  Musculoskeletal: Negative for myalgias or other joint complaints.  Skin: Negative for color change and wound or drainage.  Neurological: Negative for  syncope and numbness. other than noted Hematological: Negative for adenopathy. or other swelling Psychiatric/Behavioral: Negative for hallucinations, SI, self-injury, decreased concentration or other worsening agitation.      Objective:   Physical Exam BP 118/86 mmHg  Pulse 85  Temp(Src) 98 F (36.7 C) (Oral)  Ht 5'  4" (1.626 m)  Wt 188 lb (85.276 kg)  BMI 32.25 kg/m2  SpO2 98% VS noted,  Constitutional: Pt is oriented to person, place, and time. Appears well-developed and well-nourished, in no significant distress Head: Normocephalic and atraumatic.  Right Ear: External ear normal.  Left Ear: External ear normal.  Nose: Nose normal.  Mouth/Throat: Oropharynx is clear and moist.  Eyes: Conjunctivae and EOM are normal. Pupils are equal, round, and reactive to light.  Neck: Normal range of motion. Neck supple. No JVD present. No tracheal deviation present or significant neck LA or mass Cardiovascular: Normal rate, regular rhythm, normal heart sounds and intact distal pulses.   Pulmonary/Chest: Effort normal and breath sounds without rales or wheezing  Abdominal: Soft. Bowel sounds are normal. NT. No HSM  Musculoskeletal: Normal range of motion. Exhibits no edema.  Lymphadenopathy:  Has no cervical adenopathy.  Neurological: Pt is alert and oriented to person, place, and time. Pt has normal reflexes. No cranial nerve deficit. Motor grossly intact Skin: Skin is warm and dry. No rash noted.  Psychiatric:  Has normal mood and affect. Behavior is normal.     Assessment & Plan:

## 2015-07-26 LAB — HEPATITIS C ANTIBODY: HCV AB: NEGATIVE

## 2015-08-06 ENCOUNTER — Encounter (INDEPENDENT_AMBULATORY_CARE_PROVIDER_SITE_OTHER): Payer: 59 | Admitting: Ophthalmology

## 2015-08-06 DIAGNOSIS — H43813 Vitreous degeneration, bilateral: Secondary | ICD-10-CM | POA: Diagnosis not present

## 2015-08-06 DIAGNOSIS — H5319 Other subjective visual disturbances: Secondary | ICD-10-CM | POA: Diagnosis not present

## 2015-08-06 DIAGNOSIS — D3132 Benign neoplasm of left choroid: Secondary | ICD-10-CM | POA: Diagnosis not present

## 2015-08-08 ENCOUNTER — Encounter: Payer: Self-pay | Admitting: Obstetrics and Gynecology

## 2015-08-08 ENCOUNTER — Ambulatory Visit (INDEPENDENT_AMBULATORY_CARE_PROVIDER_SITE_OTHER): Payer: 59 | Admitting: Obstetrics and Gynecology

## 2015-08-08 VITALS — BP 130/80 | HR 80 | Resp 16 | Ht 63.25 in | Wt 186.0 lb

## 2015-08-08 DIAGNOSIS — Z01419 Encounter for gynecological examination (general) (routine) without abnormal findings: Secondary | ICD-10-CM

## 2015-08-08 NOTE — Op Note (Signed)
[facility] [address] [place], [address]   COLONOSCOPY PROCEDURE REPORT  PATIENT: Cummings, Mary T  MR#: [id number] BIRTHDATE: 11/04/1950 , 63  yrs. old GENDER: female ENDOSCOPIST: Carl E Gessner, MD, FACG PROCEDURE DATE:  09/29/2014 PROCEDURE:   Colonoscopy with snare polypectomy First Screening Colonoscopy - Avg.  risk and is 50 yrs.  old or older - No.  Prior Negative Screening - Now for repeat screening. N/A  History of Adenoma - Now for follow-up colonoscopy & has been > or = to 3 yrs.  History of Adenoma - Now for follow-up colonoscopy & has been > or = to 3 yrs.  N/A  Polyps Removed Today? Yes. ASA CLASS:   Class II INDICATIONS:high risk patient with personal history of colonic polyps. MEDICATIONS: Propofol 230 mg IV and Monitored anesthesia care  DESCRIPTION OF PROCEDURE:   After the risks benefits and alternatives of the procedure were thoroughly explained, informed consent was obtained.  The digital rectal exam revealed no abnormalities of the rectum.   The LB [id number]  endoscope was introduced through the anus and advanced to the cecum, which was identified by both the appendix and ileocecal valve. No adverse events experienced.   The quality of the prep was excellent, using MiraLax  The instrument was then slowly withdrawn as the colon was fully examined.      COLON FINDINGS: 1) 7 mm flat polyp completely removed from descending colon by cold snare and sent to pathology. 2) Otherwise normal colonoscopy with excellent prep.  Retroflexed views revealed no abnormalities. The time to cecum=2 minutes 35 seconds.  Withdrawal time=10 minutes 33 seconds.  The scope was withdrawn and the procedure completed. COMPLICATIONS: There were no immediate complications.  ENDOSCOPIC IMPRESSION: 1) 7 mm flat polyp completely removed from descending colon by cold snare and sent to pathology. 2) Otherwise normal colonoscopy with excellent prep - hx  diminutive rectal polyp removed 2005 (no pathology available)  RECOMMENDATIONS: Timing of repeat colonoscopy will be determined by pathology findings.  eSigned:  Carl E Gessner, MD, FACG 09/29/2014 12:13 PM   cc: James John, MD and The Patient        

## 2015-08-08 NOTE — Progress Notes (Signed)
Patient ID: [name], female   DOB: 09/19/1950, 65 y.o.   MRN: [id number] 65 y.o. [id number] MarriedCaucasianF here for annual exam.  S/P TAH/BSO. Not sexually active (not because of pain). No vaginal dryness or irritation. She still has hot flashes, tolerable. Rare night sweats.     No LMP recorded. Patient has had a hysterectomy.          Sexually active: No.  The current method of family planning is status post hysterectomy.    Exercising: Yes.    walking Smoker:  Former smoker  Health Maintenance: Pap:  2011 WNL per patient  History of abnormal Pap:  no MMG:  05-23-15 WNL Colonoscopy:  09-29-14 polyps, f/u in 5 years BMD:   06-06-15 osteopenic  TDaP:  03-13-10 Gardasil: N/A   reports that she quit smoking about 41 years ago. She has never used smokeless tobacco. She reports that she drinks about 1.8 oz of alcohol per week. She reports that she does not use illicit drugs. 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 Husband still working. No children.  Past Medical History  Diagnosis Date  . ALLERGIC RHINITIS 04/04/2007  . BURSITIS, RIGHT HIP 04/04/2007  . COLONIC POLYPS, HX OF 04/04/2007  . FATIGUE 05/12/2007  . HYPERLIPIDEMIA 04/04/2007  . LATERAL EPICONDYLITIS, RIGHT 03/13/2010  . 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 OSTEOARTHRITIS, KNEE, RIGHT 02/12/2009  . OSTEOARTHRITIS 04/04/2007  . OSTEOPENIA 03/13/2010  . Palpitations 05/12/2007  . Unspecified vitamin D deficiency 02/12/2009    Past Surgical History  Procedure Laterality Date  . Hemorrhoidectomoy  1988  . Abdominal hysterectomy  2002  . Kidney stone removal  2005  . Oophorectomy  2002  . Blepharoplasty Bilateral 2015  . Colonoscopy    . Cosmetic surgery  2014    eye lift    Current Outpatient Prescriptions  Medication Sig Dispense Refill  . atorvastatin (LIPITOR) 80 MG tablet Take 1 tablet (80 mg total) by mouth daily. 90 tablet 3  . Biotin w/ Vitamins C & E 1250-7.5-7.5 MCG-MG-UNT CHEW Chew 12 mg by mouth daily.     . Calcium-Magnesium-Vitamin D (CITRACAL CALCIUM+D) 600-40-500 MG-MG-UNIT TB24 Take 600 mg by mouth 2 (two) times daily.    . Cholecalciferol (VITAMIN D3) 1000 UNITS CAPS Take by mouth daily.      . Magnesium Oxide 250 MG TABS Take 250 mg by mouth daily.    Marland Kitchen VITAMIN K PO Take 500 mg by mouth daily.     No current facility-administered medications for this visit.    Family History  Problem Relation Age of Onset  . Gastric cancer Paternal Grandfather   . Heart disease Mother     died at 65yo  . Heart disease Father     died at 47yo  . Colon cancer Neg Hx     Review of Systems  Constitutional: Negative.   HENT: Negative.   Eyes: Negative.   Respiratory: Negative.   Cardiovascular: Negative.   Gastrointestinal: Negative.   Endocrine: Negative.   Genitourinary: Negative.   Musculoskeletal: Negative.   Skin: Negative.   Allergic/Immunologic: Negative.   Neurological: Negative.   Psychiatric/Behavioral: Negative.     Exam:   BP 130/80 mmHg  Pulse 80  Resp 16  Ht 5' 3.25" (1.607 m)  Wt 186 lb (84.369 kg)  BMI 32.67 kg/m2  Weight change: @WEIGHTCHANGE @ Height:   Height: 5' 3.25" (160.7 cm)  Ht Readings from Last 3 Encounters:  08/08/15 5' 3.25" (1.607 m)  07/25/15 5\' 4"  (1.626 m)  09/29/14  5\' 4"  (1.626 m)    General appearance: alert, cooperative and appears stated age Head: Normocephalic, without obvious abnormality, atraumatic Neck: no adenopathy, supple, symmetrical, trachea midline and thyroid normal to inspection and palpation Lungs: clear to auscultation bilaterally Breasts: normal appearance, no masses or tenderness, right nipple retraced (patient states both of her nipples have always retracted intermittently) Heart: regular rate and rhythm Abdomen: soft, non-tender; bowel sounds normal; no masses,  no organomegaly Extremities: extremities normal, atraumatic, no cyanosis or edema Skin: Skin color, texture, turgor normal. No rashes or lesions Lymph nodes:  Cervical, supraclavicular, and axillary nodes normal. No abnormal inguinal nodes palpated Neurologic: Grossly normal   Pelvic: External genitalia:  no lesions              Urethra:  normal appearing urethra with no masses, tenderness or lesions              Bartholins and Skenes: normal                 Vagina: very atrophic, able to tolerate a pediatric speculum              Cervix: absent               Bimanual Exam:  Uterus:  uterus absent              Adnexa: no mass, fullness, tenderness               Rectovaginal: Confirms               Anus:  normal sphincter tone, no lesions  Chaperone was present for exam.  A:  Well Woman with normal exam, h/o hysterectomy/BSO  P:   No pap needed  Mammogram UTD  DEXA with primary  Continue calcium and vit D  Colonoscopy UTD  Discussed exercise and weight loss

## 2015-08-08 NOTE — Patient Instructions (Signed)
EXERCISE AND DIET:  We recommended that you start or continue a regular exercise program for good health. Regular exercise means any activity that makes your heart beat faster and makes you sweat.  We recommend exercising at least 30 minutes per day at least 3 days a week, preferably 4 or 5.  We also recommend a diet low in fat and sugar.  Inactivity, poor dietary choices and obesity can cause diabetes, heart attack, stroke, and kidney damage, among others.    ALCOHOL AND SMOKING:  Women should limit their alcohol intake to no more than 7 drinks/beers/glasses of wine (combined, not each!) per week. Moderation of alcohol intake to this level decreases your risk of breast cancer and liver damage. And of course, no recreational drugs are part of a healthy lifestyle.  And absolutely no smoking or even second hand smoke. Most people know smoking can cause heart and lung diseases, but did you know it also contributes to weakening of your bones? Aging of your skin?  Yellowing of your teeth and nails?  CALCIUM AND VITAMIN D:  Adequate intake of calcium and Vitamin D are recommended.  The recommendations for exact amounts of these supplements seem to change often, but generally speaking 600 mg of calcium (either carbonate or citrate) and 800 units of Vitamin D per day seems prudent. Certain women may benefit from higher intake of Vitamin D.  If you are among these women, your doctor will have told you during your visit.    PAP SMEARS:  Pap smears, to check for cervical cancer or precancers,  have traditionally been done yearly, although recent scientific advances have shown that most women can have pap smears less often.  However, every woman still should have a physical exam from her gynecologist every year. It will include a breast check, inspection of the vulva and vagina to check for abnormal growths or skin changes, a visual exam of the cervix, and then an exam to evaluate the size and shape of the uterus and  ovaries.  And after 63 years of age, a rectal exam is indicated to check for rectal cancers. We will also provide age appropriate advice regarding health maintenance, like when you should have certain vaccines, screening for sexually transmitted diseases, bone density testing, colonoscopy, mammograms, etc.   MAMMOGRAMS:  All women over 40 years old should have a yearly mammogram. Many facilities now offer a "3D" mammogram, which may cost around $50 extra out of pocket. If possible,  we recommend you accept the option to have the 3D mammogram performed.  It both reduces the number of women who will be called back for extra views which then turn out to be normal, and it is better than the routine mammogram at detecting truly abnormal areas.    COLONOSCOPY:  Colonoscopy to screen for colon cancer is recommended for all women at age 50.  We know, you hate the idea of the prep.  We agree, BUT, having colon cancer and not knowing it is worse!!  Colon cancer so often starts as a polyp that can be seen and removed at colonscopy, which can quite literally save your life!  And if your first colonoscopy is normal and you have no family history of colon cancer, most women don't have to have it again for 10 years.  Once every ten years, you can do something that may end up saving your life, right?  We will be happy to help you get it scheduled when you are ready.    Be sure to check your insurance coverage so you understand how much it will cost.  It may be covered as a preventative service at no cost, but you should check your particular policy.      Breast Self-Awareness Practicing breast self-awareness may pick up problems early, prevent significant medical complications, and possibly save your life. By practicing breast self-awareness, you can become familiar with how your breasts look and feel and if your breasts are changing. This allows you to notice changes early. It can also offer you some reassurance that your  breast health is good. One way to learn what is normal for your breasts and whether your breasts are changing is to do a breast self-exam. If you find a lump or something that was not present in the past, it is best to contact your caregiver right away. Other findings that should be evaluated by your caregiver include nipple discharge, especially if it is bloody; skin changes or reddening; areas where the skin seems to be pulled in (retracted); or new lumps and bumps. Breast pain is seldom associated with cancer (malignancy), but should also be evaluated by a caregiver. HOW TO PERFORM A BREAST SELF-EXAM The best time to examine your breasts is 5-7 days after your menstrual period is over. During menstruation, the breasts are lumpier, and it may be more difficult to pick up changes. If you do not menstruate, have reached menopause, or had your uterus removed (hysterectomy), you should examine your breasts at regular intervals, such as monthly. If you are breastfeeding, examine your breasts after a feeding or after using a breast pump. Breast implants do not decrease the risk for lumps or tumors, so continue to perform breast self-exams as recommended. Talk to your caregiver about how to determine the difference between the implant and breast tissue. Also, talk about the amount of pressure you should use during the exam. Over time, you will become more familiar with the variations of your breasts and more comfortable with the exam. A breast self-exam requires you to remove all your clothes above the waist. 1. Look at your breasts and nipples. Stand in front of a mirror in a room with good lighting. With your hands on your hips, push your hands firmly downward. Look for a difference in shape, contour, and size from one breast to the other (asymmetry). Asymmetry includes puckers, dips, or bumps. Also, look for skin changes, such as reddened or scaly areas on the breasts. Look for nipple changes, such as discharge,  dimpling, repositioning, or redness. 2. Carefully feel your breasts. This is best done either in the shower or tub while using soapy water or when flat on your back. Place the arm (on the side of the breast you are examining) above your head. Use the pads (not the fingertips) of your three middle fingers on your opposite hand to feel your breasts. Start in the underarm area and use  inch (2 cm) overlapping circles to feel your breast. Use 3 different levels of pressure (light, medium, and firm pressure) at each circle before moving to the next circle. The light pressure is needed to feel the tissue closest to the skin. The medium pressure will help to feel breast tissue a little deeper, while the firm pressure is needed to feel the tissue close to the ribs. Continue the overlapping circles, moving downward over the breast until you feel your ribs below your breast. Then, move one finger-width towards the center of the body. Continue to use the    inch (2 cm) overlapping circles to feel your breast as you move slowly up toward the collar bone (clavicle) near the base of the neck. Continue the up and down exam using all 3 pressures until you reach the middle of the chest. Do this with each breast, carefully feeling for lumps or changes. 3.  Keep a written record with breast changes or normal findings for each breast. By writing this information down, you do not need to depend only on memory for size, tenderness, or location. Write down where you are in your menstrual cycle, if you are still menstruating. Breast tissue can have some lumps or thick tissue. However, see your caregiver if you find anything that concerns you.  SEEK MEDICAL CARE IF:  You see a change in shape, contour, or size of your breasts or nipples.   You see skin changes, such as reddened or scaly areas on the breasts or nipples.   You have an unusual discharge from your nipples.   You feel a new lump or unusually thick areas.     This information is not intended to replace advice given to you by your health care provider. Make sure you discuss any questions you have with your health care provider.   Document Released: 07/21/2005 Document Revised: 07/07/2012 Document Reviewed: 11/05/2011 Elsevier Interactive Patient Education 2016 [employer]  

## 2015-09-21 ENCOUNTER — Other Ambulatory Visit: Payer: Self-pay | Admitting: Internal Medicine

## 2016-04-23 ENCOUNTER — Other Ambulatory Visit: Payer: Self-pay | Admitting: Internal Medicine

## 2016-04-23 DIAGNOSIS — Z1231 Encounter for screening mammogram for malignant neoplasm of breast: Secondary | ICD-10-CM

## 2016-04-24 ENCOUNTER — Ambulatory Visit (INDEPENDENT_AMBULATORY_CARE_PROVIDER_SITE_OTHER): Payer: BLUE CROSS/BLUE SHIELD

## 2016-04-24 DIAGNOSIS — Z23 Encounter for immunization: Secondary | ICD-10-CM

## 2016-05-23 ENCOUNTER — Ambulatory Visit: Payer: 59

## 2016-06-25 ENCOUNTER — Ambulatory Visit
Admission: RE | Admit: 2016-06-25 | Discharge: 2016-06-25 | Disposition: A | Discharge status: Home / Self Care | Payer: BLUE CROSS/BLUE SHIELD | Source: Ambulatory Visit | Attending: Internal Medicine | Admitting: Internal Medicine

## 2016-06-25 DIAGNOSIS — Z1231 Encounter for screening mammogram for malignant neoplasm of breast: Secondary | ICD-10-CM | POA: Diagnosis not present

## 2016-07-08 ENCOUNTER — Telehealth: Payer: Self-pay | Admitting: Internal Medicine

## 2016-07-08 NOTE — Telephone Encounter (Signed)
[facility] Patient Name: [name] DOB: Oct 27, 1950 Initial Comment Caller states having moderate pain in lower RT abd; wants to be seen; hurts more when walking or bending a certain way; Nurse Assessment Nurse: Markus Daft, RN, [place] Date/Time (Eastern Time): 07/08/2016 1:55:47 PM Confirm and document reason for call. If symptomatic, describe symptoms. ---Caller states having moderate pain in right lower abdomen. It hurts more when walking or bending a certain way. Feels "heavy". Does the patient have any new or worsening symptoms? ---Yes Will a triage be completed? ---Yes Related visit to physician within the last 2 weeks? ---No Does the PT have any chronic conditions? (i.e. diabetes, asthma, etc.) ---No Is this a behavioral health or substance abuse call? ---No Guidelines Guideline Title Affirmed Question Affirmed Notes Abdominal Pain - Female [1] MILD-MODERATE pain AND [2] constant AND [3] present > 2 hours Final Disposition User See Physician within 4 Hours (or PCP triage) Markus Daft, RN, [facility] Comments Started last night. Non stop today since this AM. Rates 5/10. RN tried to schedule at 2:45 pm with Dr. Scarlette Calico, but this is new pt slot. Advised by staff to have her go to UC or ER. - Caller verbalizes understanding. Pt wanted appt for tomorrow at least. Time set for 11:15 am with Dr. Cathlean Cower Referrals REFERRED TO PCP OFFICE Disagree/Comply: Comply

## 2016-07-09 ENCOUNTER — Ambulatory Visit (INDEPENDENT_AMBULATORY_CARE_PROVIDER_SITE_OTHER): Payer: BLUE CROSS/BLUE SHIELD | Admitting: Internal Medicine

## 2016-07-09 ENCOUNTER — Ambulatory Visit
Admission: RE | Admit: 2016-07-09 | Discharge: 2016-07-09 | Disposition: A | Discharge status: Home / Self Care | Payer: BLUE CROSS/BLUE SHIELD | Source: Ambulatory Visit | Attending: Internal Medicine | Admitting: Internal Medicine

## 2016-07-09 ENCOUNTER — Encounter: Payer: Self-pay | Admitting: Internal Medicine

## 2016-07-09 ENCOUNTER — Other Ambulatory Visit (INDEPENDENT_AMBULATORY_CARE_PROVIDER_SITE_OTHER): Payer: BLUE CROSS/BLUE SHIELD

## 2016-07-09 DIAGNOSIS — R1031 Right lower quadrant pain: Secondary | ICD-10-CM | POA: Diagnosis not present

## 2016-07-09 LAB — URINALYSIS, ROUTINE W REFLEX MICROSCOPIC
BILIRUBIN URINE: NEGATIVE
HGB URINE DIPSTICK: NEGATIVE
KETONES UR: NEGATIVE
LEUKOCYTES UA: NEGATIVE
NITRITE: NEGATIVE
RBC / HPF: NONE SEEN (ref 0–?)
Specific Gravity, Urine: 1.015 (ref 1.000–1.030)
Total Protein, Urine: NEGATIVE
UROBILINOGEN UA: 0.2 (ref 0.0–1.0)
Urine Glucose: NEGATIVE
WBC UA: NONE SEEN (ref 0–?)
pH: 6.5 (ref 5.0–8.0)

## 2016-07-09 LAB — HEPATIC FUNCTION PANEL
ALBUMIN: 4.4 g/dL (ref 3.5–5.2)
ALT: 28 U/L (ref 0–35)
AST: 25 U/L (ref 0–37)
Alkaline Phosphatase: 96 U/L (ref 39–117)
BILIRUBIN DIRECT: 0.1 mg/dL (ref 0.0–0.3)
TOTAL PROTEIN: 7.8 g/dL (ref 6.0–8.3)
Total Bilirubin: 0.7 mg/dL (ref 0.2–1.2)

## 2016-07-09 LAB — CBC WITH DIFFERENTIAL/PLATELET
BASOS ABS: 0 10*3/uL (ref 0.0–0.1)
Basophils Relative: 0.1 % (ref 0.0–3.0)
EOS ABS: 0.1 10*3/uL (ref 0.0–0.7)
Eosinophils Relative: 0.9 % (ref 0.0–5.0)
HEMATOCRIT: 44.6 % (ref 36.0–46.0)
HEMOGLOBIN: 15.4 g/dL — AB (ref 12.0–15.0)
LYMPHS PCT: 15.9 % (ref 12.0–46.0)
Lymphs Abs: 1.2 10*3/uL (ref 0.7–4.0)
MCHC: 34.4 g/dL (ref 30.0–36.0)
MCV: 84.6 fl (ref 78.0–100.0)
MONOS PCT: 8.4 % (ref 3.0–12.0)
Monocytes Absolute: 0.6 10*3/uL (ref 0.1–1.0)
NEUTROS ABS: 5.5 10*3/uL (ref 1.4–7.7)
Neutrophils Relative %: 74.7 % (ref 43.0–77.0)
Platelets: 251 10*3/uL (ref 150.0–400.0)
RBC: 5.28 Mil/uL — AB (ref 3.87–5.11)
RDW: 13.4 % (ref 11.5–15.5)
WBC: 7.3 10*3/uL (ref 4.0–10.5)

## 2016-07-09 LAB — BASIC METABOLIC PANEL
BUN: 12 mg/dL (ref 6–23)
CALCIUM: 10.2 mg/dL (ref 8.4–10.5)
CO2: 29 meq/L (ref 19–32)
CREATININE: 0.69 mg/dL (ref 0.40–1.20)
Chloride: 103 mEq/L (ref 96–112)
GFR: 91.29 mL/min (ref >60.00)
Glucose, Bld: 110 mg/dL — ABNORMAL HIGH (ref 70–99)
Potassium: 4.2 mEq/L (ref 3.5–5.1)
Sodium: 139 mEq/L (ref 135–145)

## 2016-07-09 LAB — LIPASE: Lipase: 34 U/L (ref 11.0–59.0)

## 2016-07-09 MED ORDER — IOPAMIDOL (ISOVUE-300) INJECTION 61%
100.0000 mL | Freq: Once | INTRAVENOUS | Status: AC | PRN
  Administered 2016-07-09: 100 mL via INTRAVENOUS

## 2016-07-09 NOTE — Progress Notes (Signed)
Subjective:    Patient ID: [name], female    DOB: Aug 18, 1950, 65 y.o.   MRN: [id number]  HPI  Pt here with 2 days sudden onset RLQ pain, dull, tender to palpate without radiation, without fever, n/v but with slight reduced appetite.  Denies worsening reflux, oither abd pain, dysphagia, n/v, bowel change or blood. Denies urinary symptoms such as dysuria, frequency, urgency, flank pain, hematuria or n/v, fever, chills. + hx or renal stones but this is tender to touch.  Hurts more ambulate, nothing really makes better. Took some tylenol,  Did not eat this am.  Is s/p TAH BSO, but still has appendix and GB Past Medical History:  Diagnosis Date  . ALLERGIC RHINITIS 04/04/2007  . BURSITIS, RIGHT HIP 04/04/2007  . COLONIC POLYPS, HX OF 04/04/2007  . FATIGUE 05/12/2007  . HYPERLIPIDEMIA 04/04/2007  . LATERAL EPICONDYLITIS, RIGHT 03/13/2010  . 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 OSTEOARTHRITIS 04/04/2007  . OSTEOARTHRITIS, KNEE, RIGHT 02/12/2009  . OSTEOPENIA 03/13/2010  . Palpitations 05/12/2007  . Unspecified vitamin D deficiency 02/12/2009   Past Surgical History:  Procedure Laterality Date  . ABDOMINAL HYSTERECTOMY  2002  . BLEPHAROPLASTY Bilateral 2015  . COLONOSCOPY    . COSMETIC SURGERY  2014   eye lift  . hemorrhoidectomoy  1988  . kidney stone removal  2005  . OOPHORECTOMY  2002    reports that she quit smoking about 41 years ago. She has never used smokeless tobacco. She reports that she drinks about 1.8 oz of alcohol per week . She reports that she does not use drugs. family history includes Gastric cancer in her paternal grandfather; Heart disease in her father and mother. Allergies  Allergen Reactions  . Sulfonamide Derivatives Swelling   Current Outpatient Prescriptions on File Prior to Visit  Medication Sig Dispense Refill  . atorvastatin (LIPITOR) 80 MG tablet Take 1 tablet (80 mg total) by mouth daily. 90 tablet 3  . Biotin w/ Vitamins C & E 1250-7.5-7.5 MCG-MG-UNT CHEW Chew 12 mg  by mouth daily.    . Calcium-Magnesium-Vitamin D (CITRACAL CALCIUM+D) 600-40-500 MG-MG-UNIT TB24 Take 600 mg by mouth 2 (two) times daily.    . Cholecalciferol (VITAMIN D3) 1000 UNITS CAPS Take by mouth daily.      . Magnesium Oxide 250 MG TABS Take 250 mg by mouth daily.    Marland Kitchen VITAMIN K PO Take 500 mg by mouth daily.     No current facility-administered medications on file prior to visit.     Review of Systems  Constitutional: Negative for unusual diaphoresis or night sweats HENT: Negative for ear swelling or discharge Eyes: Negative for worsening visual haziness  Respiratory: Negative for choking and stridor.   Gastrointestinal: Negative for distension or worsening eructation Genitourinary: Negative for retention or change in urine volume.  Musculoskeletal: Negative for other MSK pain or swelling Skin: Negative for color change and worsening wound Neurological: Negative for tremors and numbness other than noted  Psychiatric/Behavioral: Negative for decreased concentration or agitation other than above   All other system neg per pt    Objective:   Physical Exam BP 130/72   Pulse 88   Temp 97.9 F (36.6 C) (Oral)   Resp 20   Wt 195 lb (88.5 kg)   SpO2 97%   BMI 34.27 kg/m  VS noted, non tocix Constitutional: Pt appears in no apparent distress HENT: Head: NCAT.  Right Ear: External ear normal.  Left Ear: External ear normal.  Eyes: . Pupils are  equal, round, and reactive to light. Conjunctivae and EOM are normal Neck: Normal range of motion. Neck supple.  Cardiovascular: Normal rate and regular rhythm.   Pulmonary/Chest: Effort normal and breath sounds without rales or wheezing.  Abd:  Soft,  ND, + BS with moderate RLQ pain without guarding or rebound Neurological: Pt is alert. Not confused , motor grossly intact Skin: Skin is warm. No rash, no LE edema Psychiatric: Pt behavior is normal. No agitation.      Assessment & Plan:

## 2016-07-09 NOTE — Progress Notes (Signed)
Pre visit review using our clinic review tool, if applicable. No additional management support is needed unless otherwise documented below in the visit note. 

## 2016-07-09 NOTE — Patient Instructions (Addendum)
Please continue all other medications as before, and refills have been done if requested.  Please have the pharmacy call with any other refills you may need.  Please keep your appointments with your specialists as you may have planned  You will be contacted regarding the referral for: CT scan  Please go to the LAB in the Basement (turn left off the elevator) for the tests to be done today  You will be contacted by phone if any changes need to be made immediately.  Otherwise, you will receive a letter about your results with an explanation, but please check with MyChart first.  Please remember to sign up for MyChart if you have not done so, as this will be important to you in the future with finding out test results, communicating by private email, and scheduling acute appointments online when needed.   

## 2016-07-09 NOTE — Assessment & Plan Note (Signed)
Etiology unclear, diff includes appendix acute, diverticulitis, MSK or even adhesions from prior surgury;  For stat labs now, then CT abd/pelvis today,  to f/u any worsening symptoms or concerns

## 2016-07-09 NOTE — Addendum Note (Signed)
Addended by: Biagio Borg on: 07/09/2016 12:55 PM   Modules accepted: Orders

## 2016-07-10 ENCOUNTER — Other Ambulatory Visit: Payer: Self-pay | Admitting: Internal Medicine

## 2016-07-10 ENCOUNTER — Ambulatory Visit: Payer: Self-pay | Admitting: Internal Medicine

## 2016-07-10 LAB — HEPATITIS C ANTIBODY

## 2016-07-21 ENCOUNTER — Other Ambulatory Visit (INDEPENDENT_AMBULATORY_CARE_PROVIDER_SITE_OTHER): Payer: BLUE CROSS/BLUE SHIELD

## 2016-07-21 DIAGNOSIS — Z0000 Encounter for general adult medical examination without abnormal findings: Secondary | ICD-10-CM | POA: Diagnosis not present

## 2016-07-21 LAB — BASIC METABOLIC PANEL
BUN: 11 mg/dL (ref 6–23)
CALCIUM: 9.4 mg/dL (ref 8.4–10.5)
CO2: 29 mEq/L (ref 19–32)
Chloride: 104 mEq/L (ref 96–112)
Creatinine, Ser: 0.72 mg/dL (ref 0.40–1.20)
GFR: 86.91 mL/min (ref >60.00)
GLUCOSE: 102 mg/dL — AB (ref 70–99)
POTASSIUM: 4.3 meq/L (ref 3.5–5.1)
Sodium: 140 mEq/L (ref 135–145)

## 2016-07-21 LAB — CBC WITH DIFFERENTIAL/PLATELET
Basophils Absolute: 0 10*3/uL (ref 0.0–0.1)
Basophils Relative: 0.6 % (ref 0.0–3.0)
EOS PCT: 1.7 % (ref 0.0–5.0)
Eosinophils Absolute: 0.1 10*3/uL (ref 0.0–0.7)
HCT: 41.5 % (ref 36.0–46.0)
HEMOGLOBIN: 14.2 g/dL (ref 12.0–15.0)
Lymphocytes Relative: 16.7 % (ref 12.0–46.0)
Lymphs Abs: 1 10*3/uL (ref 0.7–4.0)
MCHC: 34.1 g/dL (ref 30.0–36.0)
MCV: 84.6 fl (ref 78.0–100.0)
MONO ABS: 0.6 10*3/uL (ref 0.1–1.0)
Monocytes Relative: 9.7 % (ref 3.0–12.0)
Neutro Abs: 4.4 10*3/uL (ref 1.4–7.7)
Neutrophils Relative %: 71.3 % (ref 43.0–77.0)
Platelets: 234 10*3/uL (ref 150.0–400.0)
RBC: 4.91 Mil/uL (ref 3.87–5.11)
RDW: 13.4 % (ref 11.5–15.5)
WBC: 6.2 10*3/uL (ref 4.0–10.5)

## 2016-07-21 LAB — URINALYSIS, ROUTINE W REFLEX MICROSCOPIC
BILIRUBIN URINE: NEGATIVE
HGB URINE DIPSTICK: NEGATIVE
Ketones, ur: NEGATIVE
LEUKOCYTES UA: NEGATIVE
NITRITE: NEGATIVE
Specific Gravity, Urine: 1.02 (ref 1.000–1.030)
TOTAL PROTEIN, URINE-UPE24: NEGATIVE
URINE GLUCOSE: NEGATIVE
Urobilinogen, UA: 0.2 (ref 0.0–1.0)
pH: 6 (ref 5.0–8.0)

## 2016-07-21 LAB — LIPID PANEL
CHOLESTEROL: 218 mg/dL — AB (ref 0–200)
HDL: 53.4 mg/dL (ref >39.00)
LDL CALC: 131 mg/dL — AB (ref 0–99)
NONHDL: 164.67
Total CHOL/HDL Ratio: 4
Triglycerides: 170 mg/dL — ABNORMAL HIGH (ref 0.0–149.0)
VLDL: 34 mg/dL (ref 0.0–40.0)

## 2016-07-21 LAB — TSH: TSH: 2.56 u[IU]/mL (ref 0.35–4.50)

## 2016-07-21 LAB — HEPATIC FUNCTION PANEL
ALBUMIN: 4 g/dL (ref 3.5–5.2)
ALT: 26 U/L (ref 0–35)
AST: 23 U/L (ref 0–37)
Alkaline Phosphatase: 87 U/L (ref 39–117)
Bilirubin, Direct: 0 mg/dL (ref 0.0–0.3)
Total Bilirubin: 0.4 mg/dL (ref 0.2–1.2)
Total Protein: 6.7 g/dL (ref 6.0–8.3)

## 2016-07-25 ENCOUNTER — Encounter: Payer: 59 | Admitting: Internal Medicine

## 2016-08-08 DIAGNOSIS — H5203 Hypermetropia, bilateral: Secondary | ICD-10-CM | POA: Diagnosis not present

## 2016-08-08 DIAGNOSIS — H04123 Dry eye syndrome of bilateral lacrimal glands: Secondary | ICD-10-CM | POA: Diagnosis not present

## 2016-08-08 DIAGNOSIS — H524 Presbyopia: Secondary | ICD-10-CM | POA: Diagnosis not present

## 2016-08-08 DIAGNOSIS — H52223 Regular astigmatism, bilateral: Secondary | ICD-10-CM | POA: Diagnosis not present

## 2016-08-08 DIAGNOSIS — Q141 Congenital malformation of retina: Secondary | ICD-10-CM | POA: Diagnosis not present

## 2016-08-08 DIAGNOSIS — H2513 Age-related nuclear cataract, bilateral: Secondary | ICD-10-CM | POA: Diagnosis not present

## 2016-08-11 ENCOUNTER — Ambulatory Visit: Payer: 59 | Admitting: Obstetrics and Gynecology

## 2016-08-20 ENCOUNTER — Encounter: Payer: BLUE CROSS/BLUE SHIELD | Admitting: Internal Medicine

## 2016-08-28 ENCOUNTER — Encounter: Payer: Self-pay | Admitting: Obstetrics and Gynecology

## 2016-08-28 ENCOUNTER — Ambulatory Visit (INDEPENDENT_AMBULATORY_CARE_PROVIDER_SITE_OTHER): Payer: BLUE CROSS/BLUE SHIELD | Admitting: Obstetrics and Gynecology

## 2016-08-28 VITALS — BP 122/78 | HR 76 | Resp 18 | Ht 63.25 in | Wt 191.0 lb

## 2016-08-28 DIAGNOSIS — M8580 Other specified disorders of bone density and structure, unspecified site: Secondary | ICD-10-CM | POA: Diagnosis not present

## 2016-08-28 DIAGNOSIS — Z01419 Encounter for gynecological examination (general) (routine) without abnormal findings: Secondary | ICD-10-CM | POA: Diagnosis not present

## 2016-08-28 NOTE — Progress Notes (Signed)
66 y.o. G0P0000 MarriedCaucasianF here for annual exam.  H/O TAH/BSO. Not sexually active, no vaginal bleeding. No night sweats, still some mild hot flashes.  She went off of her cholesterol medication last year, just restarted her medication a few weeks ago secondary to her elevated lipids. She has also restarted walking 3 miles every day.     No LMP recorded. Patient has had a hysterectomy.          Sexually active: No.  The current method of family planning is status post hysterectomy.    Exercising: Yes.    walking Smoker:  Former smoker  Health Maintenance: Pap: 2002- Hysterectomy  History of abnormal Pap:  no MMG: 06-28-16 WNL  Colonoscopy:  09-29-14 polyps, f/u in 5 years BMD:   06-06-15 Osteopenia, followed by primary MD TDaP:  03-13-10 Gardasil: N/A   reports that she quit smoking about 42 years ago. She has never used smokeless tobacco. She reports that she drinks about 2.4 oz of alcohol per week . She reports that she does not use drugs. 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 Husband still working. No children.  Past Medical History:  Diagnosis Date  . ALLERGIC RHINITIS 04/04/2007  . BURSITIS, RIGHT HIP 04/04/2007  . COLONIC POLYPS, HX OF 04/04/2007  . FATIGUE 05/12/2007  . HYPERLIPIDEMIA 04/04/2007  . LATERAL EPICONDYLITIS, RIGHT 03/13/2010  . 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 OSTEOARTHRITIS 04/04/2007  . OSTEOARTHRITIS, KNEE, RIGHT 02/12/2009  . OSTEOPENIA 03/13/2010  . Palpitations 05/12/2007  . Unspecified vitamin D deficiency 02/12/2009    Past Surgical History:  Procedure Laterality Date  . ABDOMINAL HYSTERECTOMY  2002  . BLEPHAROPLASTY Bilateral 2015  . COLONOSCOPY    . COSMETIC SURGERY  2014   eye lift  . hemorrhoidectomoy  1988  . kidney stone removal  2005  . OOPHORECTOMY  2002    Current Outpatient Prescriptions  Medication Sig Dispense Refill  . atorvastatin (LIPITOR) 80 MG tablet Take 1 tablet (80 mg total) by mouth daily. 90 tablet 3  .  Calcium-Magnesium-Vitamin D (CITRACAL CALCIUM+D) 600-40-500 MG-MG-UNIT TB24 Take 600 mg by mouth 2 (two) times daily.    . Cholecalciferol (VITAMIN D3) 1000 UNITS CAPS Take by mouth daily.      . Magnesium Oxide 250 MG TABS Take 250 mg by mouth daily.     No current facility-administered medications for this visit.     Family History  Problem Relation Age of Onset  . Gastric cancer Paternal Grandfather   . Heart disease Mother     died at 18yo  . Heart disease Father     died at 67yo  . Colon cancer Neg Hx     Review of Systems  Constitutional: Negative.   HENT: Negative.   Eyes: Negative.   Respiratory: Negative.   Cardiovascular: Negative.   Gastrointestinal: Negative.   Endocrine: Negative.   Genitourinary: Negative.        Night urination X 1  Musculoskeletal: Negative.   Skin: Negative.   Allergic/Immunologic: Negative.   Neurological: Negative.   Psychiatric/Behavioral: Negative.     Exam:   BP 122/78 (BP Location: Right Arm, Patient Position: Sitting, Cuff Size: Normal)   Pulse 76   Resp 18   Ht 5' 3.25" (1.607 m)   Wt 191 lb (86.6 kg)   BMI 33.57 kg/m   Weight change: @WEIGHTCHANGE @ Height:   Height: 5' 3.25" (160.7 cm)  Ht Readings from Last 3 Encounters:  08/28/16 5' 3.25" (1.607 m)  08/08/15 5' 3.25" (1.607 m)  07/25/15 5\' 4"  (1.626 m)    General appearance: alert, cooperative and appears stated age Head: Normocephalic, without obvious abnormality, atraumatic Neck: no adenopathy, supple, symmetrical, trachea midline and thyroid normal to inspection and palpation Lungs: clear to auscultation bilaterally Cardiovascular: regular rate and rhythm Breasts: normal appearance, no masses or tenderness, bilaterally inverted nipples, long term Heart: regular rate and rhythm Abdomen: soft, non-tender; bowel sounds normal; no masses,  no organomegaly Extremities: extremities normal, atraumatic, no cyanosis or edema Skin: Skin color, texture, turgor normal. No  rashes or lesions Lymph nodes: Cervical, supraclavicular, and axillary nodes normal. No abnormal inguinal nodes palpated Neurologic: Grossly normal   Pelvic: External genitalia:  no lesions              Urethra:  normal appearing urethra with no masses, tenderness or lesions              Bartholins and Skenes: normal                 Vagina: normal appearing atrophic vagina with normal color and discharge, no lesions              Cervix: absent               Bimanual Exam:  Uterus:  uterus absent              Adnexa: no mass, fullness, tenderness               Rectovaginal: Confirms               Anus:  normal sphincter tone, no lesions  Chaperone was present for exam.  A:  Well Woman with normal exam  Osteopenia, followed by primary MD  H/O hysterectomy  P:   No pap needed  Mammogram and colonoscopy UTD  Labs and immunizations with primary MD  Discussed breast self exam  Discussed calcium and vit D intake

## 2016-08-28 NOTE — Patient Instructions (Signed)

## 2016-09-17 ENCOUNTER — Encounter: Payer: BLUE CROSS/BLUE SHIELD | Admitting: Internal Medicine

## 2016-10-02 ENCOUNTER — Encounter: Payer: BLUE CROSS/BLUE SHIELD | Admitting: Internal Medicine

## 2016-10-10 ENCOUNTER — Ambulatory Visit (INDEPENDENT_AMBULATORY_CARE_PROVIDER_SITE_OTHER): Payer: BLUE CROSS/BLUE SHIELD | Admitting: Internal Medicine

## 2016-10-10 ENCOUNTER — Encounter: Payer: Self-pay | Admitting: Internal Medicine

## 2016-10-10 VITALS — BP 106/76 | HR 76 | Temp 98.1°F | Ht 63.25 in | Wt 194.0 lb

## 2016-10-10 DIAGNOSIS — E785 Hyperlipidemia, unspecified: Secondary | ICD-10-CM

## 2016-10-10 DIAGNOSIS — M25572 Pain in left ankle and joints of left foot: Secondary | ICD-10-CM | POA: Insufficient documentation

## 2016-10-10 DIAGNOSIS — R739 Hyperglycemia, unspecified: Secondary | ICD-10-CM | POA: Diagnosis not present

## 2016-10-10 DIAGNOSIS — Z0001 Encounter for general adult medical examination with abnormal findings: Secondary | ICD-10-CM | POA: Diagnosis not present

## 2016-10-10 DIAGNOSIS — Z0000 Encounter for general adult medical examination without abnormal findings: Secondary | ICD-10-CM

## 2016-10-10 DIAGNOSIS — G8929 Other chronic pain: Secondary | ICD-10-CM | POA: Diagnosis not present

## 2016-10-10 MED ORDER — ATORVASTATIN CALCIUM 80 MG PO TABS: 80.0000 mg | ORAL_TABLET | Freq: Every day | ORAL | 3 refills | Status: DC

## 2016-10-10 NOTE — Assessment & Plan Note (Signed)
Minor, possible pre DM, for wt loss, diet control

## 2016-10-10 NOTE — Progress Notes (Addendum)
Subjective:    Patient ID: [name], female    DOB: 1950-08-26, 66 y.o.   MRN: [id number]  HPI  Here for wellness and f/u;  Overall doing ok;  Pt denies Chest pain, worsening SOB, DOE, wheezing, orthopnea, PND, worsening LE edema, palpitations, dizziness or syncope.  Pt denies neurological change such as new headache, facial or extremity weakness.  Pt denies polydipsia, polyuria, or low sugar symptoms. Pt states overall good compliance with treatment and medications, good tolerability, and has been trying to follow appropriate diet.  Pt denies worsening depressive symptoms, suicidal ideation or panic. No fever, night sweats, wt loss, loss of appetite, or other constitutional symptoms.  Pt states good ability with ADL's, has low fall risk, home safety reviewed and adequate, no other significant changes in hearing or vision, and only occasionally active with exercise.  Missed appt after lab work dec 2017, had to reschedule.  Does mention she did not take the statin for 10 mo last yr, but restarted after saw dec 2017 labs on mychart. Wt Readings from Last 3 Encounters:  10/10/16 194 lb (88 kg)  08/28/16 191 lb (86.6 kg)  07/09/16 195 lb (88.5 kg)   BP Readings from Last 3 Encounters:  10/10/16 106/76  08/28/16 122/78  07/09/16 130/72   Does have specific complaint of left ankle pain and numbness only to the lateral aspect , x 1 yr, mild but persistent now for about 1 year. Has seen ortho ([facility]) with neg xrays except for mild DJD, did rx short course prednisone with limited effect,  Sound like DVT ruled out as well.  Past Medical History:  Diagnosis Date  . ALLERGIC RHINITIS 04/04/2007  . BURSITIS, RIGHT HIP 04/04/2007  . COLONIC POLYPS, HX OF 04/04/2007  . FATIGUE 05/12/2007  . HYPERLIPIDEMIA 04/04/2007  . LATERAL EPICONDYLITIS, RIGHT 03/13/2010  . 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 OSTEOARTHRITIS 04/04/2007  . OSTEOARTHRITIS, KNEE, RIGHT 02/12/2009  . OSTEOPENIA 03/13/2010  .  Palpitations 05/12/2007  . Unspecified vitamin D deficiency 02/12/2009   Past Surgical History:  Procedure Laterality Date  . ABDOMINAL HYSTERECTOMY  2002  . BLEPHAROPLASTY Bilateral 2015  . COLONOSCOPY    . COSMETIC SURGERY  2014   eye lift  . hemorrhoidectomoy  1988  . kidney stone removal  2005  . OOPHORECTOMY  2002    reports that she quit smoking about 42 years ago. She has never used smokeless tobacco. She reports that she drinks about 2.4 oz of alcohol per week . She reports that she does not use drugs. family history includes Gastric cancer in her paternal grandfather; Heart disease in her father and mother. Allergies  Allergen Reactions  . Sulfonamide Derivatives Swelling   Current Outpatient Prescriptions on File Prior to Visit  Medication Sig Dispense Refill  . Calcium-Magnesium-Vitamin D (CITRACAL CALCIUM+D) 600-40-500 MG-MG-UNIT TB24 Take 600 mg by mouth 2 (two) times daily.    . Cholecalciferol (VITAMIN D3) 1000 UNITS CAPS Take by mouth daily.      . Magnesium Oxide 250 MG TABS Take 250 mg by mouth daily.     No current facility-administered medications on file prior to visit.    tReview of Systems Constitutional: Negative for increased diaphoresis, or other activity, appetite or siginficant weight change other than noted HENT: Negative for worsening hearing loss, ear pain, facial swelling, mouth sores and neck stiffness.   Eyes: Negative for other worsening pain, redness or visual disturbance.  Respiratory: Negative for choking or stridor Cardiovascular: Negative for other  chest pain and palpitations.  Gastrointestinal: Negative for worsening diarrhea, blood in stool, or abdominal distention Genitourinary: Negative for hematuria, flank pain or change in urine volume.  Musculoskeletal: Negative for myalgias or other joint complaints.  Skin: Negative for other color change and wound or drainage.  Neurological: Negative for syncope and numbness. other than  noted Hematological: Negative for adenopathy. or other swelling Psychiatric/Behavioral: Negative for hallucinations, SI, self-injury, decreased concentration or other worsening agitation.  All other system neg per pt    Objective:   Physical Exam BP 106/76   Pulse 76   Temp 98.1 F (36.7 C)   Ht 5' 3.25" (1.607 m)   Wt 194 lb (88 kg)   SpO2 99%   BMI 34.09 kg/m  VS noted,  Constitutional: Pt is oriented to person, place, and time. Appears well-developed and well-nourished, in no significant distress Head: Normocephalic and atraumatic  Eyes: Conjunctivae and EOM are normal. Pupils are equal, round, and reactive to light Right Ear: External ear normal.  Left Ear: External ear normal Nose: Nose normal.  Mouth/Throat: Oropharynx is clear and moist  Neck: Normal range of motion. Neck supple. No JVD present. No tracheal deviation present or significant neck LA or mass Cardiovascular: Normal rate, regular rhythm, normal heart sounds and intact distal pulses.   Pulmonary/Chest: Effort normal and breath sounds without rales or wheezing  Abdominal: Soft. Bowel sounds are normal. NT. No HSM  Musculoskeletal: Normal range of motion. Exhibits no edema, but does have some localized puffiness, numb/tender to left lateral ankle about the lateral malleolus Lymphadenopathy: Has no cervical adenopathy.  Neurological: Pt is alert and oriented to person, place, and time. Pt has normal reflexes. No cranial nerve deficit. Motor grossly intact Skin: Skin is warm and dry. No rash noted or new ulcers Psychiatric:  Has normal mood and affect. Behavior is normal.  No other exam findings  Lab Results  Component Value Date   WBC 6.2 07/21/2016   HGB 14.2 07/21/2016   HCT 41.5 07/21/2016   PLT 234.0 07/21/2016   GLUCOSE 102 (H) 07/21/2016   CHOL 218 (H) 07/21/2016   TRIG 170.0 (H) 07/21/2016   HDL 53.40 07/21/2016   LDLDIRECT 162.0 03/06/2010   LDLCALC 131 (H) 07/21/2016   ALT 26 07/21/2016   AST 23  07/21/2016   NA 140 07/21/2016   K 4.3 07/21/2016   CL 104 07/21/2016   CREATININE 0.72 07/21/2016   BUN 11 07/21/2016   CO2 29 07/21/2016   TSH 2.56 07/21/2016   ECG I have personally interpreted today Sinus  Rhythm  -  Negative precordial T-waves  -Probably normal      Assessment & Plan:

## 2016-10-10 NOTE — Assessment & Plan Note (Signed)
Now on statin for about 10 yrs except did not take for about 10 mo last yr, encouraged compliance today to reduce future CV risk

## 2016-10-10 NOTE — Patient Instructions (Signed)
Please continue all other medications as before, and refills have been done if requested.  Please have the pharmacy call with any other refills you may need.  Please continue your efforts at being more active, low cholesterol diet, and weight control.  You are otherwise up to date with prevention measures today.  Please keep your appointments with your specialists as you may have planned  You will be contacted regarding the referral for: Dr Tamala Julian (although you can ask for an appt as you leave today as well)  Please return in 1 year for your yearly visit, or sooner if needed, with Lab testing done 3-5 days before

## 2016-10-10 NOTE — Assessment & Plan Note (Addendum)
Exam c/w possible ganglion cyst vs tarsal tendon issue - ok for referral to Dr Smith/sport med for further evaluation  In addition to the time spent performing CPE, I spent an additional 15 minutes face to face,in which greater than 50% of this time was spent in counseling and coordination of care for patient's illness as documented.

## 2016-10-26 NOTE — Progress Notes (Signed)
[facility] [address] [place], [place] [address] Phone: [phone] Subjective:    I'm seeing this patient by the request  of:  Cathlean Cower, MD   CC: Left foot and ankle pain  [id number]  [name] is a 66 y.o. female coming in with complaint of left ankle and foot pain. Been going on for approximately 1 year. States that there is usually pain and numbness over the lateral aspect the ankle. Was intermittent at first but now seems to be more persistent. Has seen another provider for the form. Did have x-rays for this she states. Was told that she had mild arthritic changes. Was given prednisone with very minimal improvement. States that nothing seems to help it significantly.      Past Medical History:  Diagnosis Date  . ALLERGIC RHINITIS 04/04/2007  . BURSITIS, RIGHT HIP 04/04/2007  . COLONIC POLYPS, HX OF 04/04/2007  . FATIGUE 05/12/2007  . HYPERLIPIDEMIA 04/04/2007  . LATERAL EPICONDYLITIS, RIGHT 03/13/2010  . 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 OSTEOARTHRITIS 04/04/2007  . OSTEOARTHRITIS, KNEE, RIGHT 02/12/2009  . OSTEOPENIA 03/13/2010  . Palpitations 05/12/2007  . Unspecified vitamin D deficiency 02/12/2009   Past Surgical History:  Procedure Laterality Date  . ABDOMINAL HYSTERECTOMY  2002  . BLEPHAROPLASTY Bilateral 2015  . COLONOSCOPY    . COSMETIC SURGERY  2014   eye lift  . hemorrhoidectomoy  1988  . kidney stone removal  2005  . OOPHORECTOMY  2002   Social History   Social History  . Marital status: Married    Spouse name: N/A  . Number of children: N/A  . Years of education: N/A   Occupational History  . 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 Smoking status: Former Smoker    Quit date: 08/04/1974  . Smokeless tobacco: Never Used     Comment: was an occ smoker then  . Alcohol use 2.4 oz/week    4 Glasses of wine per week  . Drug use: No  . Sexual activity: No   Other Topics Concern  . None   Social  History Narrative  . None   Allergies  Allergen Reactions  . Sulfonamide Derivatives Swelling   Family History  Problem Relation Age of Onset  . Gastric cancer Paternal Grandfather   . Heart disease Mother     died at 69yo  . Heart disease Father     died at 38yo  . Colon cancer Neg Hx     Past medical history, social, surgical and family history all reviewed in electronic medical record.  No pertanent information unless stated regarding to the chief complaint.   Review of Systems:Review of systems updated and as accurate as of 10/27/16  No headache, visual changes, nausea, vomiting, diarrhea, constipation, dizziness, abdominal pain, skin rash, fevers, chills, night sweats, weight loss, swollen lymph nodes, body aches, joint swelling, muscle aches, chest pain, shortness of breath, mood changes.   Objective  Blood pressure 118/84, pulse 81, height 5' 3.25" (1.607 m), weight 195 lb 9.6 oz (88.7 kg), SpO2 97 %. Systems examined below as of 10/27/16   General: No apparent distress alert and oriented x3 mood and affect normal, dressed appropriately.  HEENT: Pupils equal, extraocular movements intact  Respiratory: Patient's speak in full sentences and does not appear short of breath  Cardiovascular: No lower extremity edema, non tender, no erythema  Skin: Warm dry intact with no signs of infection or rash on  extremities or on axial skeleton.  Abdomen: Soft nontender  Neuro: Cranial nerves II through XII are intact, neurovascularly intact in all extremities with 2+ DTRs and 2+ pulses.  Lymph: No lymphadenopathy of posterior or anterior cervical chain or axillae bilaterally.  Gait normal with good balance and coordination.  MSK:  Non tender with full range of motion and good stability and symmetric strength and tone of shoulders, elbows, wrist, hip, knees bilaterally.  Ankle: Left No visible erythema or swelling. Range of motion is full in all directions. Strength testing shows some  mild weakness of eversion of the ankle Stable lateral and medial ligaments; squeeze test and kleiger test unremarkable; Talar dome nontender; No pain at base of 5th MT; No tenderness over cuboid; Patient is mildly tender to palpation over the lateral malleolus area.  Able to walk 4 steps. Contralateral ankle is unremarkable Foot exam shows severe breakdown of the transverse arch. Mild supination of the hindfoot bilaterally left greater than right  MSK US performed of: Left ankle This study was ordered, performed, and interpreted by Charlann Boxer D.O.  Foot/Ankle:   All structures visualized.   Talar dome unremarkable  Ankle mortise without effusion. Mild hypoechoic changes of the peroneal tendon Posterior tibialis, flexor hallucis longus, and flexor digitorum longus tendons unremarkable on long and transverse views without sheath effusions. Lateral malleolus does show significant hypoechoic changes and increasing Doppler flow of the inferior aspect of the lateral malleolus..  IMPRESSION:  Possible nonhealing stress reaction  Procedure note 16606; 15 minutes spent for Therapeutic exercises as stated in above notes.  This included exercises focusing on stretching, strengthening, with significant focus on eccentric aspects. Ankle strengthening that included:  Basic range of motion exercises to allow proper full motion at ankle Stretching of the lower leg and hamstrings  Theraband exercises for the lower leg - inversion, eversion, dorsiflexion and plantarflexion each to be completed with a theraband Balance exercises to increase proprioception Weight bearing exercises to increase strength and balance    Proper technique shown and discussed handout in great detail with ATC.  All questions were discussed and answered.     Impression and Recommendations:     This case required medical decision making of moderate complexity.      Note: This dictation was prepared with Dragon dictation  along with smaller phrase technology. Any transcriptional errors that result from this process are unintentional.

## 2016-10-27 ENCOUNTER — Ambulatory Visit: Payer: Self-pay

## 2016-10-27 ENCOUNTER — Encounter: Payer: Self-pay | Admitting: Family Medicine

## 2016-10-27 ENCOUNTER — Ambulatory Visit (INDEPENDENT_AMBULATORY_CARE_PROVIDER_SITE_OTHER): Payer: BLUE CROSS/BLUE SHIELD | Admitting: Family Medicine

## 2016-10-27 ENCOUNTER — Ambulatory Visit (INDEPENDENT_AMBULATORY_CARE_PROVIDER_SITE_OTHER)
Admission: RE | Admit: 2016-10-27 | Discharge: 2016-10-27 | Disposition: A | Discharge status: Home / Self Care | Payer: BLUE CROSS/BLUE SHIELD | Source: Ambulatory Visit | Attending: Family Medicine | Admitting: Family Medicine

## 2016-10-27 VITALS — BP 118/84 | HR 81 | Ht 63.25 in | Wt 195.6 lb

## 2016-10-27 DIAGNOSIS — M7732 Calcaneal spur, left foot: Secondary | ICD-10-CM | POA: Diagnosis not present

## 2016-10-27 DIAGNOSIS — M216X9 Other acquired deformities of unspecified foot: Secondary | ICD-10-CM

## 2016-10-27 DIAGNOSIS — E559 Vitamin D deficiency, unspecified: Secondary | ICD-10-CM | POA: Diagnosis not present

## 2016-10-27 DIAGNOSIS — G8929 Other chronic pain: Secondary | ICD-10-CM

## 2016-10-27 DIAGNOSIS — M25572 Pain in left ankle and joints of left foot: Secondary | ICD-10-CM

## 2016-10-27 MED ORDER — VITAMIN D (ERGOCALCIFEROL) 1.25 MG (50000 UNIT) PO CAPS: 50000.0000 [IU] | ORAL_CAPSULE | ORAL | 0 refills | Status: DC

## 2016-10-27 NOTE — Assessment & Plan Note (Signed)
Started once weekly vitamin D 

## 2016-10-27 NOTE — Assessment & Plan Note (Signed)
Discussed metatarsal pads 

## 2016-10-27 NOTE — Assessment & Plan Note (Signed)
Patient has what appears to be more of a lateral malleolus stress fracture. I do not understand why it is not healing. Discussed with patient at great length. History of osteopenia. Patient will be put on once weekly vitamin D, patient was given a brace to wear with increasing activity. We discussed icing regimen. Discussed proper shoes. Discuss patient breakdown of the transverse arch of the foot could be contributing. Patient will start these different conservative therapies and follow-up with me again in 4 weeks

## 2016-10-27 NOTE — Patient Instructions (Signed)
Good to see you  Ice 20 minutes 2 times daily. Usually after activity and before bed. Exercises 3 times a week.  Wear brace with walking the next 3 weeks if you can.  Avoid being bare foot We will have you do once weekly vitamin D for 12 weeks.  Also pennsaid pinkie amount topically 2 times daily as needed.   See me again in 3-4 weeks to make sure you are doing well.

## 2016-11-21 ENCOUNTER — Encounter: Payer: Self-pay | Admitting: Family Medicine

## 2016-11-21 ENCOUNTER — Ambulatory Visit (INDEPENDENT_AMBULATORY_CARE_PROVIDER_SITE_OTHER): Payer: BLUE CROSS/BLUE SHIELD | Admitting: Family Medicine

## 2016-11-21 ENCOUNTER — Ambulatory Visit: Payer: Self-pay

## 2016-11-21 VITALS — BP 98/78 | HR 92 | Resp 16 | Wt 193.0 lb

## 2016-11-21 DIAGNOSIS — M25572 Pain in left ankle and joints of left foot: Secondary | ICD-10-CM

## 2016-11-21 NOTE — Assessment & Plan Note (Signed)
Healing lateral malleolus fracture. We discussed proper shoes and as well as supporting patient's transverse arch. I think patient will do well with conservative therapy at this time. Worsening symptoms come back again in 4 weeks. Encourage her to continue the vitamin D supplementation at this time.

## 2016-11-21 NOTE — Progress Notes (Signed)
Pre-visit discussion using our clinic review tool. No additional management support is needed unless otherwise documented below in the visit note.  

## 2016-11-21 NOTE — Progress Notes (Signed)
[facility] [place], [place] [address] Phone: [phone] Subjective:    I'm seeing this patient by the request  of:  Cathlean Cower, MD   CC: Left foot and ankle pain f/u  [id number]  [name] is a 66 y.o. female coming in with complaint of left ankle and foot pain. Patient was previously seen and had more of a nonhealing lateral malleolus fracture. Patient was putting air cast and given range of motion exercises. Patient was started on vitamin D. Patient states 90% better at this time. Very minimal. Nothing severe. Overall feeling better. Has been wearing the brace regularly. Doing the exercises occasionally. Icing intermittently.    Past Medical History:  Diagnosis Date  . ALLERGIC RHINITIS 04/04/2007  . BURSITIS, RIGHT HIP 04/04/2007  . COLONIC POLYPS, HX OF 04/04/2007  . FATIGUE 05/12/2007  . HYPERLIPIDEMIA 04/04/2007  . LATERAL EPICONDYLITIS, RIGHT 03/13/2010  . 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 OSTEOARTHRITIS 04/04/2007  . OSTEOARTHRITIS, KNEE, RIGHT 02/12/2009  . OSTEOPENIA 03/13/2010  . Palpitations 05/12/2007  . Unspecified vitamin D deficiency 02/12/2009   Past Surgical History:  Procedure Laterality Date  . ABDOMINAL HYSTERECTOMY  2002  . BLEPHAROPLASTY Bilateral 2015  . COLONOSCOPY    . COSMETIC SURGERY  2014   eye lift  . hemorrhoidectomoy  1988  . kidney stone removal  2005  . OOPHORECTOMY  2002   Social History   Social History  . Marital status: Married    Spouse name: N/A  . Number of children: N/A  . Years of education: N/A   Occupational History  . 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 Smoking status: Former Smoker    Quit date: 08/04/1974  . Smokeless tobacco: Never Used     Comment: was an occ smoker then  . Alcohol use 2.4 oz/week    4 Glasses of wine per week  . Drug use: No  . Sexual activity: No   Other Topics Concern  . Not on file   Social History Narrative  . No  narrative on file   Allergies  Allergen Reactions  . Sulfonamide Derivatives Swelling   Family History  Problem Relation Age of Onset  . Gastric cancer Paternal Grandfather   . Heart disease Mother     died at 39yo  . Heart disease Father     died at 8yo  . Colon cancer Neg Hx     Past medical history, social, surgical and family history all reviewed in electronic medical record.  No pertanent information unless stated regarding to the chief complaint.   Review of Systems: No headache, visual changes, nausea, vomiting, diarrhea, constipation, dizziness, abdominal pain, skin rash, fevers, chills, night sweats, weight loss, swollen lymph nodes, body aches, joint swelling, muscle aches, chest pain, shortness of breath, mood changes.    Objective  Blood pressure 98/78, pulse 92, resp. rate 16, weight 193 lb (87.5 kg), SpO2 98 %. Systems examined below as of 11/21/16   Systems examined below as of 11/21/16 General: NAD A&O x3 mood, affect normal  HEENT: Pupils equal, extraocular movements intact no nystagmus Respiratory: not short of breath at rest or with speaking Cardiovascular: No lower extremity edema, non tender Skin: Warm dry intact with no signs of infection or rash on extremities or on axial skeleton. Abdomen: Soft nontender, no masses Neuro: Cranial nerves  intact, neurovascularly intact in all extremities with 2+ DTRs and 2+ pulses. Lymph: No lymphadenopathy  appreciated today  Gait normal with good balance and coordination. Walking normally MSK: Non tender with full range of motion and good stability and symmetric strength and tone of shoulders, elbows, wrist,  knee hips  bilaterally.   Ankle: Left No visible erythema or swelling. Range of motion is full in all directions. Strength testing shows some mild weakness of eversion of the ankle Stable lateral and medial ligaments; squeeze test and kleiger test unremarkable; Talar dome nontender; No pain at base of 5th MT; No  tenderness over cuboid; Nontender over the lateral malleolus. Contralateral ankle is unremarkable Foot exam shows severe breakdown of the transverse arch. Mild supination of the hindfoot bilaterally left greater than right change from previous exam except significant less tender  MSK US performed of: Left ankle This study was ordered, performed, and interpreted by Charlann Boxer D.O.  Foot/Ankle:   Limited exam of patient's lateral malleolus does show good callus formation. Decreased significantly hypoechoic changes.  IMPRESSION:  Healing lateral malleolus fracture..       Impression and Recommendations:     This case required medical decision making of moderate complexity.      Note: This dictation was prepared with Dragon dictation along with smaller phrase technology. Any transcriptional errors that result from this process are unintentional.

## 2016-11-21 NOTE — Patient Instructions (Addendum)
Good to see you  [name] is your friend.  You can use the brace only with a lot of activity and with working out for another month.  Continue the vitamin D You are doing great  See me again in 4 weeks.

## 2016-12-19 ENCOUNTER — Ambulatory Visit: Payer: BLUE CROSS/BLUE SHIELD | Admitting: Internal Medicine

## 2017-01-13 ENCOUNTER — Other Ambulatory Visit: Payer: Self-pay | Admitting: Family Medicine

## 2017-01-14 ENCOUNTER — Ambulatory Visit: Payer: Self-pay

## 2017-01-14 ENCOUNTER — Ambulatory Visit (INDEPENDENT_AMBULATORY_CARE_PROVIDER_SITE_OTHER): Payer: BLUE CROSS/BLUE SHIELD | Admitting: Family Medicine

## 2017-01-14 ENCOUNTER — Encounter: Payer: Self-pay | Admitting: Family Medicine

## 2017-01-14 VITALS — BP 122/74 | HR 77 | Ht 63.25 in | Wt 192.0 lb

## 2017-01-14 DIAGNOSIS — M25562 Pain in left knee: Secondary | ICD-10-CM

## 2017-01-14 DIAGNOSIS — M7662 Achilles tendinitis, left leg: Secondary | ICD-10-CM | POA: Insufficient documentation

## 2017-01-14 MED ORDER — VITAMIN D (ERGOCALCIFEROL) 1.25 MG (50000 UNIT) PO CAPS: 50000.0000 [IU] | ORAL_CAPSULE | ORAL | 0 refills | Status: DC

## 2017-01-14 NOTE — Assessment & Plan Note (Signed)
Patient does have a very mild Achilles tendinitis. This is a new problem. We discussed range of motion exercises, proper shoes, icing and topical anti-inflammatories. Patient come back in 4 weeks for further evaluation.

## 2017-01-14 NOTE — Progress Notes (Signed)
[name] [address] [place], [place] [address] Phone: [phone] Subjective:    I'm seeing this patient by the request  of:  Biagio Borg, MD   CC: Left foot and ankle pain f/u  [id number]  [name] is a 66 y.o. female coming in with complaint of left ankle and foot pain. Patient was previously seen and had more of a nonhealing lateral malleolus fracture. Patient was putting air cast and given range of motion exercises. Patient was started on vitamin D. Patient was 90% better. Patient states that now she is 95% better but unfortunately is having lateral ankle numbness that is intermittent. Patient is also noticed tightness in the Achilles.  Past Medical History:  Diagnosis Date  . ALLERGIC RHINITIS 04/04/2007  . BURSITIS, RIGHT HIP 04/04/2007  . COLONIC POLYPS, HX OF 04/04/2007  . FATIGUE 05/12/2007  . HYPERLIPIDEMIA 04/04/2007  . LATERAL EPICONDYLITIS, RIGHT 03/13/2010  . 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 OSTEOARTHRITIS 04/04/2007  . OSTEOARTHRITIS, KNEE, RIGHT 02/12/2009  . OSTEOPENIA 03/13/2010  . Palpitations 05/12/2007  . Unspecified vitamin D deficiency 02/12/2009   Past Surgical History:  Procedure Laterality Date  . ABDOMINAL HYSTERECTOMY  2002  . BLEPHAROPLASTY Bilateral 2015  . COLONOSCOPY    . COSMETIC SURGERY  2014   eye lift  . hemorrhoidectomoy  1988  . kidney stone removal  2005  . OOPHORECTOMY  2002   Social History   Social History  . Marital status: Married    Spouse name: N/A  . Number of children: N/A  . Years of education: N/A   Occupational History  . 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 Smoking status: Former Smoker    Quit date: 08/04/1974  . Smokeless tobacco: Never Used     Comment: was an occ smoker then  . Alcohol use 2.4 oz/week    4 Glasses of wine per week  . Drug use: No  . Sexual activity: No   Other Topics Concern  . Not on file   Social History Narrative  . No  narrative on file   Allergies  Allergen Reactions  . Sulfonamide Derivatives Swelling   Family History  Problem Relation Age of Onset  . Gastric cancer Paternal Grandfather   . Heart disease Mother        died at 38yo  . Heart disease Father        died at 10yo  . Colon cancer Neg Hx     Past medical history, social, surgical and family history all reviewed in electronic medical record.  No pertanent information unless stated regarding to the chief complaint.   Review of Systems: No headache, visual changes, nausea, vomiting, diarrhea, constipation, dizziness, abdominal pain, skin rash, fevers, chills, night sweats, weight loss, swollen lymph nodes, body aches, joint swelling, muscle aches, chest pain, shortness of breath, mood changes.     Objective  There were no vitals taken for this visit. Systems examined below as of 01/14/17   Systems examined below as of 01/14/17 General: NAD A&O x3 mood, affect normal  HEENT: Pupils equal, extraocular movements intact no nystagmus Respiratory: not short of breath at rest or with speaking Cardiovascular: No lower extremity edema, non tender Skin: Warm dry intact with no signs of infection or rash on extremities or on axial skeleton. Abdomen: Soft nontender, no masses Neuro: Cranial nerves  intact, neurovascularly intact in all extremities with 2+ DTRs and 2+ pulses. Lymph:  No lymphadenopathy appreciated today  Gait normal with good balance and coordination.  MSK: Non tender with full range of motion and good stability and symmetric strength and tone of shoulders, elbows, wrist,  knee hips bilaterally.   Ankle: Left No visible erythema or swelling. Mild tightness of the last 5 dorsiflexion Strength testing shows some mild weakness of eversion of the ankle Stable lateral and medial ligaments; squeeze test and kleiger test unremarkable; Talar dome nontender; No pain at base of 5th MT; No tenderness over cuboid; Nontender over the  lateral malleolus. Contralateral ankle is unremarkable Foot exam shows severe breakdown of the transverse arch. Mild supination of the hindfoot bilaterally left greater than right change from previous exam except significant less tender  MSK US performed of: Left ankle This study was ordered, performed, and interpreted by Charlann Boxer D.O.  Foot/Ankle:   Lateral malleolus is healed. Patient's Achilles does show patient has a mild increase in Doppler flow.. No true tear. Should.  IMPRESSION:  Healed lateral malleolus fracture with mild reactive Achilles tendinosis       Impression and Recommendations:     This case required medical decision making of moderate complexity.      Note: This dictation was prepared with Dragon dictation along with smaller phrase technology. Any transcriptional errors that result from this process are unintentional.

## 2017-01-14 NOTE — Patient Instructions (Addendum)
Good to see you  B12 1085mcg daily  B6 200mg  daily  Shoes with a little heel would be great  pennsaid pinkie amount topically 2 times daily as needed.   Spell alphabet with ankle when sitting.  If not gone in 4 weeks see me again but you will do fine.

## 2017-04-27 ENCOUNTER — Telehealth: Payer: Self-pay | Admitting: Internal Medicine

## 2017-04-27 ENCOUNTER — Encounter: Payer: Self-pay | Admitting: Internal Medicine

## 2017-04-27 NOTE — Telephone Encounter (Signed)
Pt would like orders put in for her Dexa scan she states it is time for it, please send to  [place] Please advise and call back when done

## 2017-04-28 ENCOUNTER — Other Ambulatory Visit: Payer: Self-pay | Admitting: Internal Medicine

## 2017-04-28 DIAGNOSIS — E2839 Other primary ovarian failure: Secondary | ICD-10-CM

## 2017-04-28 NOTE — Telephone Encounter (Signed)
Done hardcopy to Shirron  

## 2017-04-28 NOTE — Telephone Encounter (Signed)
LVM informing pt that orders have been faxed

## 2017-04-30 ENCOUNTER — Ambulatory Visit: Payer: BLUE CROSS/BLUE SHIELD

## 2017-05-01 ENCOUNTER — Ambulatory Visit (INDEPENDENT_AMBULATORY_CARE_PROVIDER_SITE_OTHER): Payer: BLUE CROSS/BLUE SHIELD

## 2017-05-01 DIAGNOSIS — Z23 Encounter for immunization: Secondary | ICD-10-CM

## 2017-05-06 ENCOUNTER — Other Ambulatory Visit: Payer: Self-pay | Admitting: Internal Medicine

## 2017-05-06 DIAGNOSIS — Z1231 Encounter for screening mammogram for malignant neoplasm of breast: Secondary | ICD-10-CM

## 2017-06-29 ENCOUNTER — Ambulatory Visit
Admission: RE | Admit: 2017-06-29 | Discharge: 2017-06-29 | Disposition: A | Discharge status: Home / Self Care | Payer: BLUE CROSS/BLUE SHIELD | Source: Ambulatory Visit | Attending: Internal Medicine | Admitting: Internal Medicine

## 2017-06-29 DIAGNOSIS — M8589 Other specified disorders of bone density and structure, multiple sites: Secondary | ICD-10-CM | POA: Diagnosis not present

## 2017-06-29 DIAGNOSIS — Z780 Asymptomatic menopausal state: Secondary | ICD-10-CM | POA: Diagnosis not present

## 2017-06-29 DIAGNOSIS — Z1231 Encounter for screening mammogram for malignant neoplasm of breast: Secondary | ICD-10-CM | POA: Diagnosis not present

## 2017-06-29 DIAGNOSIS — E2839 Other primary ovarian failure: Secondary | ICD-10-CM

## 2017-06-30 ENCOUNTER — Other Ambulatory Visit: Payer: Self-pay | Admitting: Internal Medicine

## 2017-06-30 ENCOUNTER — Telehealth: Payer: Self-pay | Admitting: Internal Medicine

## 2017-06-30 ENCOUNTER — Telehealth: Payer: Self-pay

## 2017-06-30 ENCOUNTER — Other Ambulatory Visit: Payer: Self-pay | Admitting: Family Medicine

## 2017-06-30 MED ORDER — ALENDRONATE SODIUM 70 MG PO TABS
70.0000 mg | ORAL_TABLET | ORAL | 3 refills | Status: DC
Start: 2017-06-30 — End: 2017-09-03

## 2017-06-30 MED ORDER — VITAMIN D (ERGOCALCIFEROL) 1.25 MG (50000 UNIT) PO CAPS: 50000.0000 [IU] | ORAL_CAPSULE | ORAL | 0 refills | Status: DC

## 2017-06-30 NOTE — Telephone Encounter (Signed)
-----   Message from Biagio Borg, MD sent at 06/30/2017  9:04 AM EST ----- Left message on MyChart, pt to cont same tx except  The test results show that your current treatment is OK, except there is mild bone loss found called osteopenia.  This is not to the severity of osteoporosis, but even at this level, you should consider taking a medication to help slow the process for worsening, and hopefully to avoid future fractures.  I will send a prescription for Fosamax to the pharmacy, We can also discuss at your next visit in Mar 2019 if you are not sure about this.    Shirron to please inform pt, I will do rx

## 2017-06-30 NOTE — Telephone Encounter (Signed)
Pt. returned call to receive results of Bone Density scan.  Informed of Dr. Gwynn Burly explanation of results, and recommendations for treatment. The pt. stated she is not comfortable taking Fosamax, and asked if there is an alternative to this.  Also would like to have an explanation and better understanding of the numbers in the Bone Density Test.  Advised will send message to Dr. Jenny Reichmann with above questions.  Pt. Agreed.     Copied from [facility] #[id number]. Topic: General - Other >> Jun 30, 2017 11:42 AM [name], CMA wrote: Reason for CRM: To discuss Bone Density 06/29/17 results. Ok to inform patient.

## 2017-06-30 NOTE — Telephone Encounter (Signed)
Please advise and call back

## 2017-06-30 NOTE — Telephone Encounter (Signed)
Please advise 

## 2017-06-30 NOTE — Telephone Encounter (Signed)
Pt has been informed and expressed understanding and will make an appt soon to come in to discuss.

## 2017-06-30 NOTE — Telephone Encounter (Signed)
Called pt, LVM.  CRM entered.

## 2017-06-30 NOTE — Telephone Encounter (Signed)
Unfortunately the explanation and counseling cannot be done over email or messages  Plesae consider ROV, or we can discuss at next [place]

## 2017-07-15 DIAGNOSIS — L821 Other seborrheic keratosis: Secondary | ICD-10-CM | POA: Diagnosis not present

## 2017-07-15 DIAGNOSIS — I781 Nevus, non-neoplastic: Secondary | ICD-10-CM | POA: Diagnosis not present

## 2017-07-15 DIAGNOSIS — L719 Rosacea, unspecified: Secondary | ICD-10-CM | POA: Diagnosis not present

## 2017-07-15 DIAGNOSIS — L738 Other specified follicular disorders: Secondary | ICD-10-CM | POA: Diagnosis not present

## 2017-08-14 DIAGNOSIS — H04123 Dry eye syndrome of bilateral lacrimal glands: Secondary | ICD-10-CM | POA: Diagnosis not present

## 2017-08-14 DIAGNOSIS — Q141 Congenital malformation of retina: Secondary | ICD-10-CM | POA: Diagnosis not present

## 2017-08-14 DIAGNOSIS — H2513 Age-related nuclear cataract, bilateral: Secondary | ICD-10-CM | POA: Diagnosis not present

## 2017-09-03 ENCOUNTER — Ambulatory Visit (INDEPENDENT_AMBULATORY_CARE_PROVIDER_SITE_OTHER): Payer: BLUE CROSS/BLUE SHIELD | Admitting: Obstetrics and Gynecology

## 2017-09-03 ENCOUNTER — Other Ambulatory Visit: Payer: Self-pay

## 2017-09-03 ENCOUNTER — Encounter: Payer: Self-pay | Admitting: Obstetrics and Gynecology

## 2017-09-03 VITALS — BP 146/78 | HR 80 | Resp 16 | Ht 63.25 in | Wt 183.0 lb

## 2017-09-03 DIAGNOSIS — Z01419 Encounter for gynecological examination (general) (routine) without abnormal findings: Secondary | ICD-10-CM

## 2017-09-03 DIAGNOSIS — M8580 Other specified disorders of bone density and structure, unspecified site: Secondary | ICD-10-CM

## 2017-09-03 NOTE — Progress Notes (Signed)
67 y.o. G0P0000 MarriedCaucasianF here for annual exam.  H/O TAH/BSO, not sexually active.  She is loosing weight (she has lost 10 lbs in 6 weeks), walking most days. She eats healthy.     No LMP recorded. Patient has had a hysterectomy.          Sexually active: No.  The current method of family planning is post menopausal status/ hysterectomy.    Exercising: Yes.    walking Smoker:  former  Health Maintenance: Pap:  unsure History of abnormal Pap:  no MMG:  06-29-17 WNL  Colonoscopy:  09-29-14 polyps repeat in 5 yrs  BMD:   06-29-17 Osteopenia, FRAX 13.3/1.2 TDaP:  2011 Gardasil: N/A   reports that she quit smoking about 43 years ago. she has never used smokeless tobacco. She reports that she drinks about 2.4 oz of alcohol per week. She reports that she does not use drugs. 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 No children. Married 40 years this coming summer. Husband is 35, still working, likes it.   Past Medical History:  Diagnosis Date  . ALLERGIC RHINITIS 04/04/2007  . BURSITIS, RIGHT HIP 04/04/2007  . COLONIC POLYPS, HX OF 04/04/2007  . FATIGUE 05/12/2007  . HYPERLIPIDEMIA 04/04/2007  . LATERAL EPICONDYLITIS, RIGHT 03/13/2010  . 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 OSTEOARTHRITIS 04/04/2007  . OSTEOARTHRITIS, KNEE, RIGHT 02/12/2009  . OSTEOPENIA 03/13/2010  . Palpitations 05/12/2007  . Unspecified vitamin D deficiency 02/12/2009    Past Surgical History:  Procedure Laterality Date  . ABDOMINAL HYSTERECTOMY  2002  . BLEPHAROPLASTY Bilateral 2015  . COLONOSCOPY    . COSMETIC SURGERY  2014   eye lift  . hemorrhoidectomoy  1988  . kidney stone removal  2005  . OOPHORECTOMY  2002    Current Outpatient Medications  Medication Sig Dispense Refill  . aspirin EC 81 MG tablet Take 81 mg by mouth daily.    Marland Kitchen atorvastatin (LIPITOR) 80 MG tablet Take 1 tablet (80 mg total) by mouth daily. 90 tablet 3  . Calcium Carb-Cholecalciferol (CALCIUM 500 + D3 PO) Take by mouth.    .  Magnesium Oxide 250 MG TABS Take 250 mg by mouth daily.    . vitamin C (ASCORBIC ACID) 500 MG tablet Take 500 mg by mouth daily.    . Vitamin D, Ergocalciferol, (DRISDOL) 50000 units CAPS capsule Take 1 capsule (50,000 Units total) by mouth every 7 (seven) days. 12 capsule 0  . VITAMIN K PO Take 100 mcg by mouth.     No current facility-administered medications for this visit.     Family History  Problem Relation Age of Onset  . Gastric cancer Paternal Grandfather   . Heart disease Mother        died at 49yo  . Heart disease Father        died at 75yo  . Colon cancer Neg Hx     Review of Systems  Constitutional: Negative.   HENT: Negative.   Eyes: Negative.   Respiratory: Negative.   Cardiovascular: Negative.   Gastrointestinal: Negative.   Endocrine: Negative.   Genitourinary: Negative.   Musculoskeletal: Positive for myalgias.  Skin: Negative.   Allergic/Immunologic: Negative.   Neurological: Negative.   Psychiatric/Behavioral: Negative.     Exam:   BP (!) 146/78 (BP Location: Right Arm, Patient Position: Sitting, Cuff Size: Normal)   Pulse 80   Resp 16   Ht 5' 3.25" (1.607 m)   Wt 183 lb (83 kg)   BMI 32.16 kg/m  Weight change: @WEIGHTCHANGE @ Height:   Height: 5' 3.25" (160.7 cm)  Ht Readings from Last 3 Encounters:  09/03/17 5' 3.25" (1.607 m)  01/14/17 5' 3.25" (1.607 m)  10/27/16 5' 3.25" (1.607 m)    General appearance: alert, cooperative and appears stated age Head: Normocephalic, without obvious abnormality, atraumatic Neck: no adenopathy, supple, symmetrical, trachea midline and thyroid normal to inspection and palpation Lungs: clear to auscultation bilaterally Cardiovascular: regular rate and rhythm Breasts: normal appearance, no masses or tenderness Abdomen: soft, non-tender; non distended,  no masses,  no organomegaly Extremities: extremities normal, atraumatic, no cyanosis or edema Skin: Skin color, texture, turgor normal. No rashes or  lesions Lymph nodes: Cervical, supraclavicular, and axillary nodes normal. No abnormal inguinal nodes palpated Neurologic: Grossly normal   Pelvic: External genitalia:  no lesions              Urethra:  normal appearing urethra with no masses, tenderness or lesions              Bartholins and Skenes: normal                 Vagina: normal appearing vagina with normal color and discharge, no lesions              Cervix: absent               Bimanual Exam:  Uterus:  uterus absent              Adnexa: no mass, fullness, tenderness               Rectovaginal: Confirms               Anus:  normal sphincter tone, no lesions  Chaperone was present for exam.  A:  Well Woman with normal exam  Osteopenia, not to the point of needing treatment  P:   Labs with primary  Continue calcium, vit d and exercise  No pap needed  Mammogram and colonoscopy UTD  Breast self awareness discussed

## 2017-09-03 NOTE — Patient Instructions (Signed)

## 2017-09-09 ENCOUNTER — Encounter: Payer: Self-pay | Admitting: Internal Medicine

## 2017-09-09 DIAGNOSIS — E559 Vitamin D deficiency, unspecified: Secondary | ICD-10-CM

## 2017-09-19 ENCOUNTER — Other Ambulatory Visit: Payer: Self-pay | Admitting: Internal Medicine

## 2017-09-28 ENCOUNTER — Other Ambulatory Visit: Payer: Self-pay | Admitting: Internal Medicine

## 2017-09-28 NOTE — Telephone Encounter (Signed)
OK to change to OTC Vit d 2000 units per day as this rx is not normally a long term rx

## 2017-09-29 ENCOUNTER — Other Ambulatory Visit (INDEPENDENT_AMBULATORY_CARE_PROVIDER_SITE_OTHER): Payer: BLUE CROSS/BLUE SHIELD

## 2017-09-29 DIAGNOSIS — E559 Vitamin D deficiency, unspecified: Secondary | ICD-10-CM | POA: Diagnosis not present

## 2017-09-29 DIAGNOSIS — Z0000 Encounter for general adult medical examination without abnormal findings: Secondary | ICD-10-CM | POA: Diagnosis not present

## 2017-09-29 DIAGNOSIS — R739 Hyperglycemia, unspecified: Secondary | ICD-10-CM | POA: Diagnosis not present

## 2017-09-29 LAB — CBC WITH DIFFERENTIAL/PLATELET
Basophils Absolute: 0 10*3/uL (ref 0.0–0.1)
Basophils Relative: 0.5 % (ref 0.0–3.0)
Eosinophils Absolute: 0.1 10*3/uL (ref 0.0–0.7)
Eosinophils Relative: 1.7 % (ref 0.0–5.0)
HEMATOCRIT: 40.8 % (ref 36.0–46.0)
HEMOGLOBIN: 13.8 g/dL (ref 12.0–15.0)
LYMPHS PCT: 15.2 % (ref 12.0–46.0)
Lymphs Abs: 1.2 10*3/uL (ref 0.7–4.0)
MCHC: 33.8 g/dL (ref 30.0–36.0)
MCV: 85.6 fl (ref 78.0–100.0)
MONO ABS: 0.7 10*3/uL (ref 0.1–1.0)
Monocytes Relative: 8.6 % (ref 3.0–12.0)
Neutro Abs: 5.6 10*3/uL (ref 1.4–7.7)
Neutrophils Relative %: 74 % (ref 43.0–77.0)
Platelets: 235 10*3/uL (ref 150.0–400.0)
RBC: 4.76 Mil/uL (ref 3.87–5.11)
RDW: 13.7 % (ref 11.5–15.5)
WBC: 7.6 10*3/uL (ref 4.0–10.5)

## 2017-09-29 LAB — URINALYSIS, ROUTINE W REFLEX MICROSCOPIC
Bilirubin Urine: NEGATIVE
Hgb urine dipstick: NEGATIVE
Ketones, ur: NEGATIVE
Leukocytes, UA: NEGATIVE
Nitrite: NEGATIVE
PH: 5 (ref 5.0–8.0)
Specific Gravity, Urine: >=1.03 — AB (ref 1.000–1.030)
Total Protein, Urine: NEGATIVE
Urine Glucose: NEGATIVE
Urobilinogen, UA: 0.2 (ref 0.0–1.0)

## 2017-09-29 LAB — LIPID PANEL
CHOLESTEROL: 171 mg/dL (ref 0–200)
HDL: 48.8 mg/dL (ref >39.00)
LDL Cholesterol: 98 mg/dL (ref 0–99)
NONHDL: 122.62
Total CHOL/HDL Ratio: 4
Triglycerides: 121 mg/dL (ref 0.0–149.0)
VLDL: 24.2 mg/dL (ref 0.0–40.0)

## 2017-09-29 LAB — BASIC METABOLIC PANEL
BUN: 19 mg/dL (ref 6–23)
CALCIUM: 10.7 mg/dL — AB (ref 8.4–10.5)
CO2: 30 mEq/L (ref 19–32)
Chloride: 102 mEq/L (ref 96–112)
Creatinine, Ser: 0.65 mg/dL (ref 0.40–1.20)
GFR: 97.43 mL/min (ref >60.00)
GLUCOSE: 92 mg/dL (ref 70–99)
POTASSIUM: 4.3 meq/L (ref 3.5–5.1)
Sodium: 139 mEq/L (ref 135–145)

## 2017-09-29 LAB — HEPATIC FUNCTION PANEL
ALT: 18 U/L (ref 0–35)
AST: 17 U/L (ref 0–37)
Albumin: 4.1 g/dL (ref 3.5–5.2)
Alkaline Phosphatase: 110 U/L (ref 39–117)
BILIRUBIN TOTAL: 0.8 mg/dL (ref 0.2–1.2)
Bilirubin, Direct: 0.2 mg/dL (ref 0.0–0.3)
TOTAL PROTEIN: 7.4 g/dL (ref 6.0–8.3)

## 2017-09-29 LAB — HEMOGLOBIN A1C: Hgb A1c MFr Bld: 5.4 % (ref 4.6–6.5)

## 2017-09-29 LAB — VITAMIN D 25 HYDROXY (VIT D DEFICIENCY, FRACTURES): VITD: 87.18 ng/mL (ref 30.00–100.00)

## 2017-09-29 LAB — TSH: TSH: 1.52 u[IU]/mL (ref 0.35–4.50)

## 2017-09-29 MED ORDER — VITAMIN D 50 MCG (2000 UT) PO TABS: 2000.0000 [IU] | ORAL_TABLET | Freq: Every day | ORAL | 11 refills | Status: AC

## 2017-09-29 NOTE — Telephone Encounter (Signed)
Called pt no answer LMOM w/MD response../lm,b 

## 2017-09-30 LAB — HIV ANTIBODY (ROUTINE TESTING W REFLEX): HIV 1&2 Ab, 4th Generation: NONREACTIVE

## 2017-10-06 DIAGNOSIS — I87393 Chronic venous hypertension (idiopathic) with other complications of bilateral lower extremity: Secondary | ICD-10-CM | POA: Diagnosis not present

## 2017-10-13 ENCOUNTER — Other Ambulatory Visit: Payer: BLUE CROSS/BLUE SHIELD

## 2017-10-13 ENCOUNTER — Encounter: Payer: Self-pay | Admitting: Internal Medicine

## 2017-10-13 ENCOUNTER — Ambulatory Visit (INDEPENDENT_AMBULATORY_CARE_PROVIDER_SITE_OTHER): Payer: BLUE CROSS/BLUE SHIELD | Admitting: Internal Medicine

## 2017-10-13 VITALS — BP 114/78 | HR 87 | Temp 98.2°F | Ht 63.25 in | Wt 182.0 lb

## 2017-10-13 DIAGNOSIS — R739 Hyperglycemia, unspecified: Secondary | ICD-10-CM | POA: Diagnosis not present

## 2017-10-13 DIAGNOSIS — Z0000 Encounter for general adult medical examination without abnormal findings: Secondary | ICD-10-CM

## 2017-10-13 MED ORDER — ATORVASTATIN CALCIUM 80 MG PO TABS: 80.0000 mg | ORAL_TABLET | Freq: Every day | ORAL | 3 refills | Status: DC

## 2017-10-13 NOTE — Progress Notes (Signed)
Subjective:    Patient ID: [name], female    DOB: May 17, 1951, 67 y.o.   MRN: [id number]  HPI  Here for wellness and f/u;  Overall doing ok;  Pt denies Chest pain, worsening SOB, DOE, wheezing, orthopnea, PND, worsening LE edema, palpitations, dizziness or syncope.  Pt denies neurological change such as new headache, facial or extremity weakness.  Pt denies polydipsia, polyuria, or low sugar symptoms. Pt states overall good compliance with treatment and medications, good tolerability, and has been trying to follow appropriate diet.  Pt denies worsening depressive symptoms, suicidal ideation or panic. No fever, night sweats, wt loss, loss of appetite, or other constitutional symptoms.  Pt states good ability with ADL's, has low fall risk, home safety reviewed and adequate, no other significant changes in hearing or vision, and only occasionally active with exercise. Lost 10 lbs with better diet and more activty, walks 3-4 times per wk up to 3.5 miles, now more active recent finding osteroporosis and hyperglycemia., does not want fosamax due to risk of worsening periodontal dz, but might consider prolia after next DXA.   No other new complaints or interval hx Wt Readings from Last 3 Encounters:  10/13/17 182 lb (82.6 kg)  09/03/17 183 lb (83 kg)  01/14/17 192 lb (87.1 kg)   Past Medical History:  Diagnosis Date  . ALLERGIC RHINITIS 04/04/2007  . BURSITIS, RIGHT HIP 04/04/2007  . COLONIC POLYPS, HX OF 04/04/2007  . FATIGUE 05/12/2007  . HYPERLIPIDEMIA 04/04/2007  . LATERAL EPICONDYLITIS, RIGHT 03/13/2010  . 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 OSTEOARTHRITIS 04/04/2007  . OSTEOARTHRITIS, KNEE, RIGHT 02/12/2009  . OSTEOPENIA 03/13/2010  . Palpitations 05/12/2007  . Unspecified vitamin D deficiency 02/12/2009   Past Surgical History:  Procedure Laterality Date  . ABDOMINAL HYSTERECTOMY  2002  . BLEPHAROPLASTY Bilateral 2015  . COLONOSCOPY    . COSMETIC SURGERY  2014   eye lift  . hemorrhoidectomoy   1988  . kidney stone removal  2005  . OOPHORECTOMY  2002    reports that she quit smoking about 43 years ago. she has never used smokeless tobacco. She reports that she drinks about 2.4 oz of alcohol per week. She reports that she does not use drugs. family history includes Gastric cancer in her paternal grandfather; Heart disease in her father and mother. Allergies  Allergen Reactions  . Sulfonamide Derivatives Swelling   Current Outpatient Medications on File Prior to Visit  Medication Sig Dispense Refill  . aspirin EC 81 MG tablet Take 81 mg by mouth daily.    . Calcium Carb-Cholecalciferol (CALCIUM 500 + D3 PO) Take by mouth.    . chlorhexidine (PERIDEX) 0.12 % solution Use as directed 15 mLs in the mouth or throat 2 (two) times daily.    . Cholecalciferol (VITAMIN D) 2000 units tablet Take 1 tablet (2,000 Units total) by mouth daily. 30 tablet 11  . Glucosamine-Chondroitin (MOVE FREE PO) Take by mouth.    . Magnesium Oxide 250 MG TABS Take 250 mg by mouth daily.    Marland Kitchen VITAMIN K PO Take 100 mcg by mouth.     No current facility-administered medications on file prior to visit.    Review of Systems Constitutional: Negative for other unusual diaphoresis, sweats, appetite or weight changes HENT: Negative for other worsening hearing loss, ear pain, facial swelling, mouth sores or neck stiffness.   Eyes: Negative for other worsening pain, redness or other visual disturbance.  Respiratory: Negative for other stridor or swelling Cardiovascular: Negative  for other palpitations or other chest pain  Gastrointestinal: Negative for worsening diarrhea or loose stools, blood in stool, distention or other pain Genitourinary: Negative for hematuria, flank pain or other change in urine volume.  Musculoskeletal: Negative for myalgias or other joint swelling.  Skin: Negative for other color change, or other wound or worsening drainage.  Neurological: Negative for other syncope or  numbness. Hematological: Negative for other adenopathy or swelling Psychiatric/Behavioral: Negative for hallucinations, other worsening agitation, SI, self-injury, or new decreased concentration All other system neg per pt    Objective:   Physical Exam BP 114/78   Pulse 87   Temp 98.2 F (36.8 C) (Oral)   Ht 5' 3.25" (1.607 m)   Wt 182 lb (82.6 kg)   SpO2 99%   BMI 31.99 kg/m  VS noted,  Constitutional: Pt is oriented to person, place, and time. Appears well-developed and well-nourished, in no significant distress and comfortable Head: Normocephalic and atraumatic  Eyes: Conjunctivae and EOM are normal. Pupils are equal, round, and reactive to light Right Ear: External ear normal without discharge Left Ear: External ear normal without discharge Nose: Nose without discharge or deformity Mouth/Throat: Oropharynx is without other ulcerations and moist  Neck: Normal range of motion. Neck supple. No JVD present. No tracheal deviation present or significant neck LA or mass Cardiovascular: Normal rate, regular rhythm, normal heart sounds and intact distal pulses. Pulmonary/Chest: WOB normal and breath sounds without rales or wheezing  Abdominal: Soft. Bowel sounds are normal. NT. No HSM  Musculoskeletal: Normal range of motion. Exhibits no edema Lymphadenopathy: Has no other cervical adenopathy.  Neurological: Pt is alert and oriented to person, place, and time. Pt has normal reflexes. No cranial nerve deficit. Motor grossly intact, Gait intact Skin: Skin is warm and dry. No rash noted or new ulcerations Psychiatric:  Has normal mood and affect. Behavior is normal without agitation No other exam findings Lab Results  Component Value Date   WBC 7.6 09/29/2017   HGB 13.8 09/29/2017   HCT 40.8 09/29/2017   PLT 235.0 09/29/2017   GLUCOSE 92 09/29/2017   CHOL 171 09/29/2017   TRIG 121.0 09/29/2017   HDL 48.80 09/29/2017   LDLDIRECT 162.0 03/06/2010   LDLCALC 98 09/29/2017   ALT 18  09/29/2017   AST 17 09/29/2017   NA 139 09/29/2017   K 4.3 09/29/2017   CL 102 09/29/2017   CREATININE 0.65 09/29/2017   BUN 19 09/29/2017   CO2 30 09/29/2017   TSH 1.52 09/29/2017   HGBA1C 5.4 09/29/2017       Assessment & Plan:

## 2017-10-13 NOTE — Assessment & Plan Note (Signed)

## 2017-10-13 NOTE — Patient Instructions (Signed)
Please continue all other medications as before, and refills have been done if requested.  Please have the pharmacy call with any other refills you may need.  Please continue your efforts at being more active, low cholesterol diet, and weight control.  You are otherwise up to date with prevention measures today.  Please keep your appointments with your specialists as you may have planned  Please go to the LAB in the Basement (turn left off the elevator) for the tests to be done today - just the PTH level today  You will be contacted by phone if any changes need to be made immediately.  Otherwise, you will receive a letter about your results with an explanation, but please check with MyChart first.  Please remember to sign up for MyChart if you have not done so, as this will be important to you in the future with finding out test results, communicating by private email, and scheduling acute appointments online when needed.  Please return in 1 year for your yearly visit, or sooner if needed, with Lab testing done 3-5 days before

## 2017-10-13 NOTE — Assessment & Plan Note (Signed)
Mild, pt asks for PTH level

## 2017-10-13 NOTE — Assessment & Plan Note (Signed)
stable overall by history and exam, recent data reviewed with pt, and pt to continue medical treatment as before,  to f/u any worsening symptoms or concerns  

## 2017-10-14 LAB — PTH, INTACT AND CALCIUM
Calcium: 10.2 mg/dL (ref 8.6–10.4)
PTH: 53 pg/mL (ref 14–64)

## 2017-12-17 ENCOUNTER — Other Ambulatory Visit: Payer: Self-pay | Admitting: Internal Medicine

## 2017-12-28 ENCOUNTER — Other Ambulatory Visit: Payer: Self-pay | Admitting: Internal Medicine

## 2018-03-26 IMAGING — CT CT ABD-PELV W/ CM
2 of 5 series · 16 of 46 positions shown, 18 images · IV contrast (APPLIED)
Comparison: CT urogram of 11/13/2005

CLINICAL DATA: Right lower quadrant pain

EXAM:
CT ABDOMEN AND PELVIS WITH CONTRAST
TECHNIQUE: Multidetector CT imaging of the abdomen and pelvis was performed
using the standard protocol following bolus administration of
intravenous contrast.
CONTRAST:  100mL FAHMI5-VUU IOPAMIDOL (FAHMI5-VUU) INJECTION 61%

[Series 2: abd/pelvis w/cm · axial · 0.82mm/px · z∈[+686,+1096]mm · 13 of 92 slices shown, 15 images]
[im 5/92  soft-tissue]
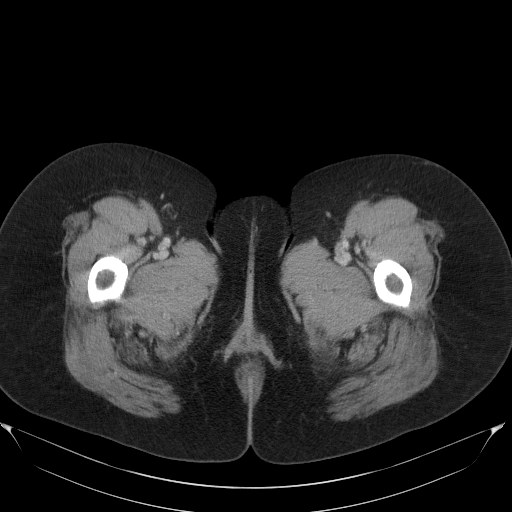
[im 5/92  bone]
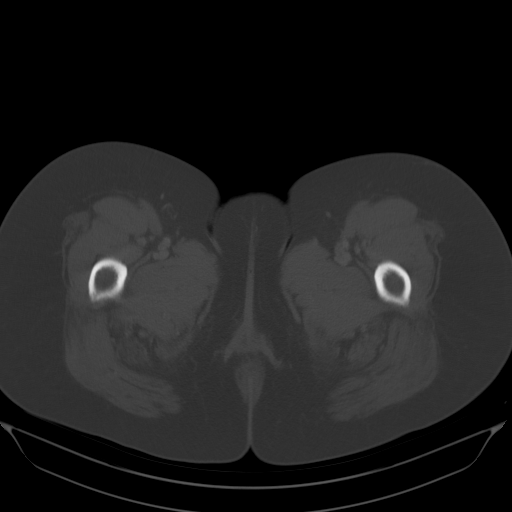
[im 15/92  soft-tissue]
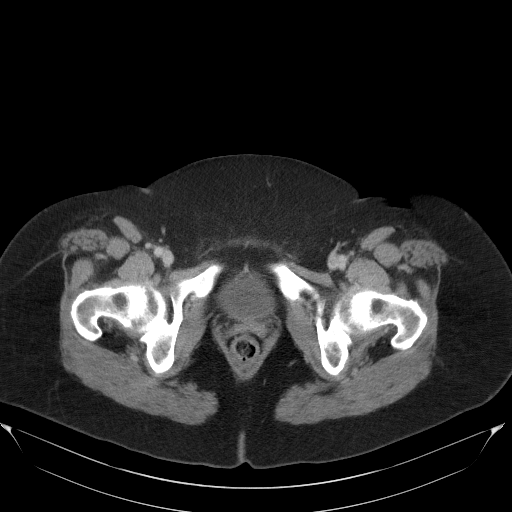
[im 20/92  soft-tissue]
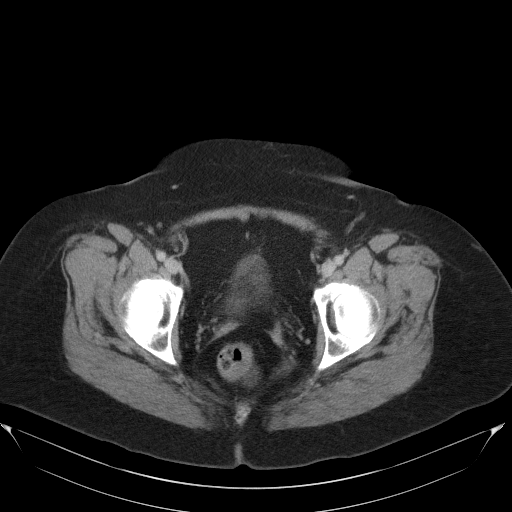
[im 24/92  soft-tissue]
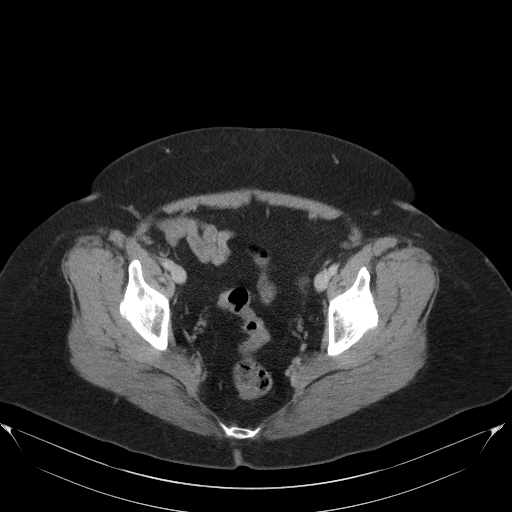
[im 34/92  soft-tissue]
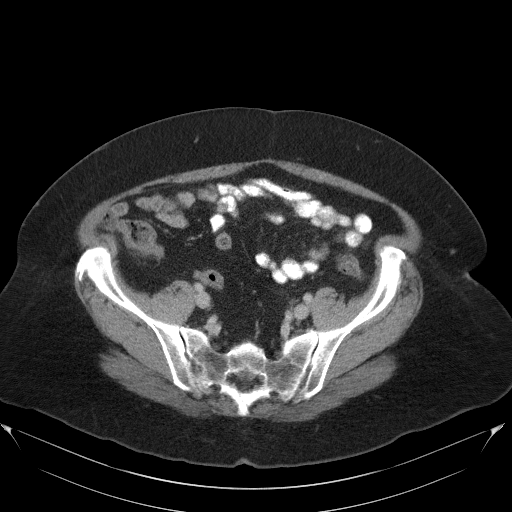
[im 39/92  soft-tissue]
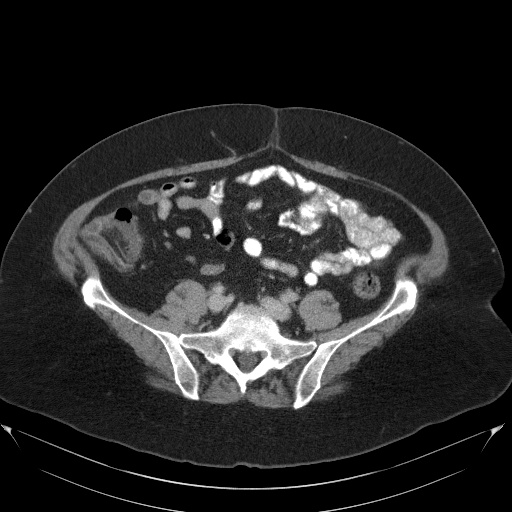
[im 48/92  soft-tissue]
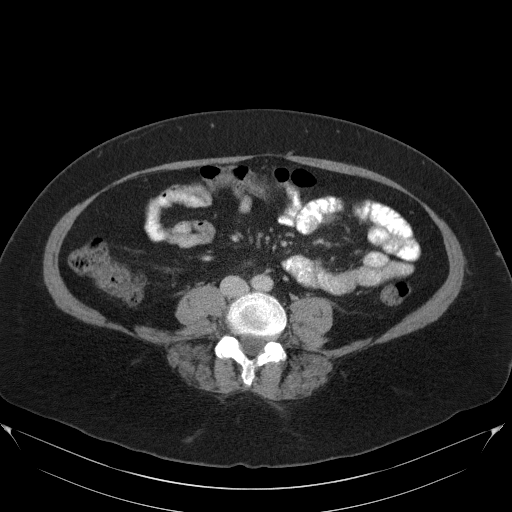
[im 53/92  soft-tissue]
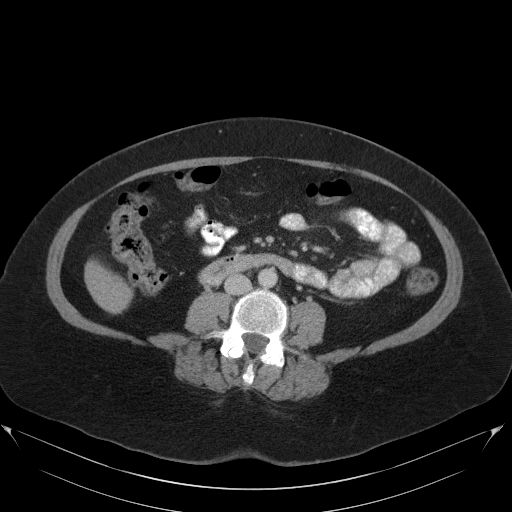
[im 58/92  soft-tissue]
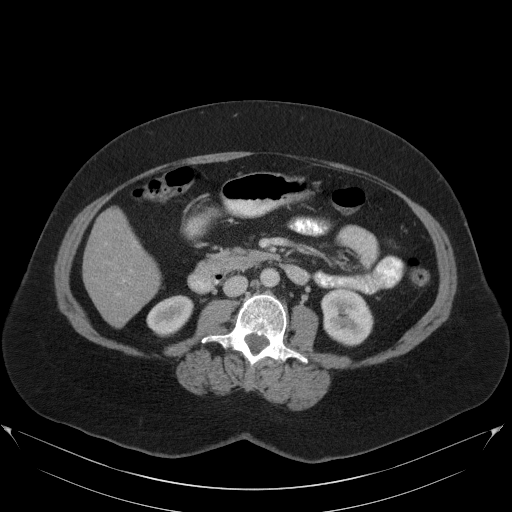
[im 58/92  bone]
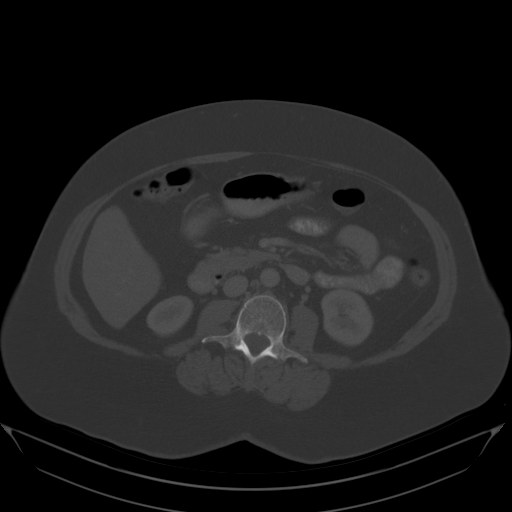
[im 68/92  soft-tissue]
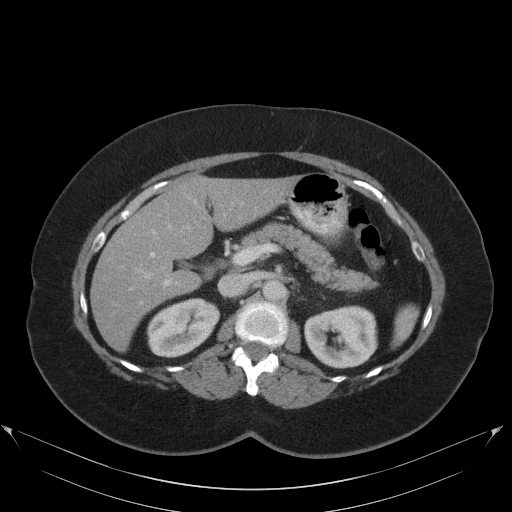
[im 72/92  soft-tissue]
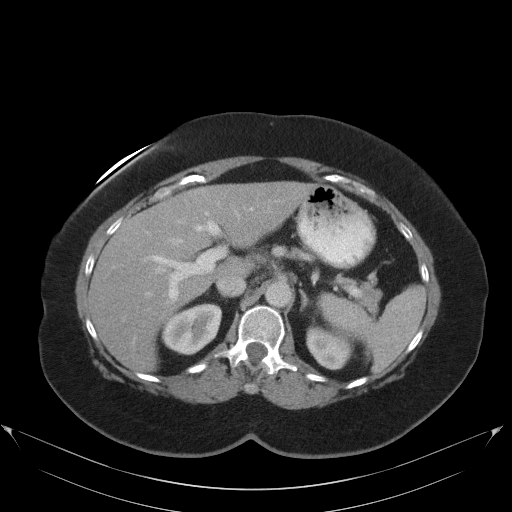
[im 77/92  soft-tissue]
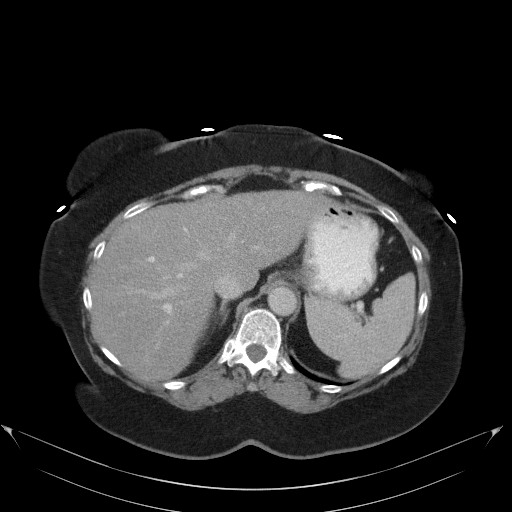
[im 87/92  soft-tissue]
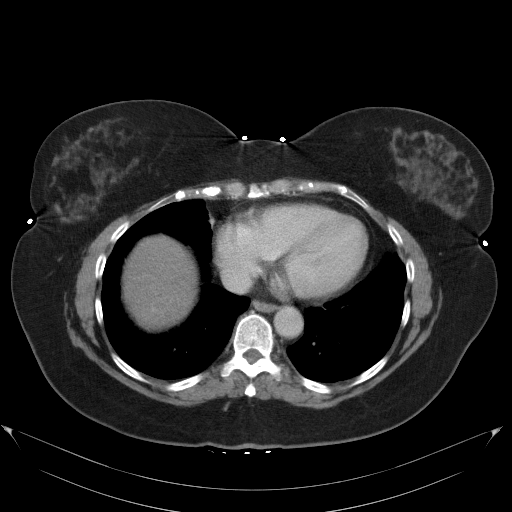

[Series 3: cor · coronal · 0.75mm/px · 3 of 93 slices shown]
[im 31/93  soft-tissue]
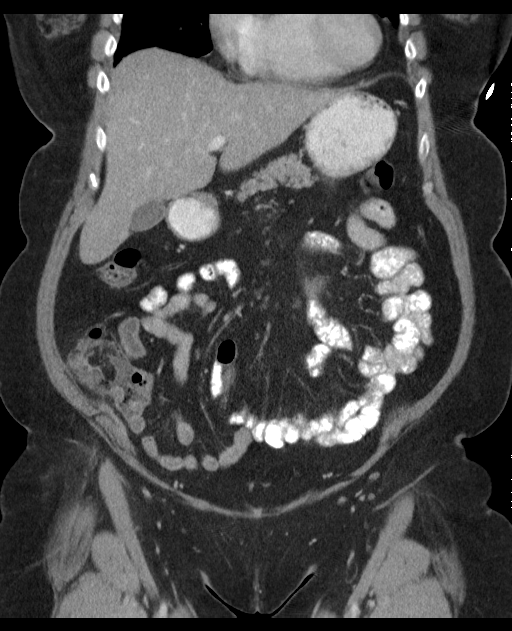
[im 41/93  soft-tissue]
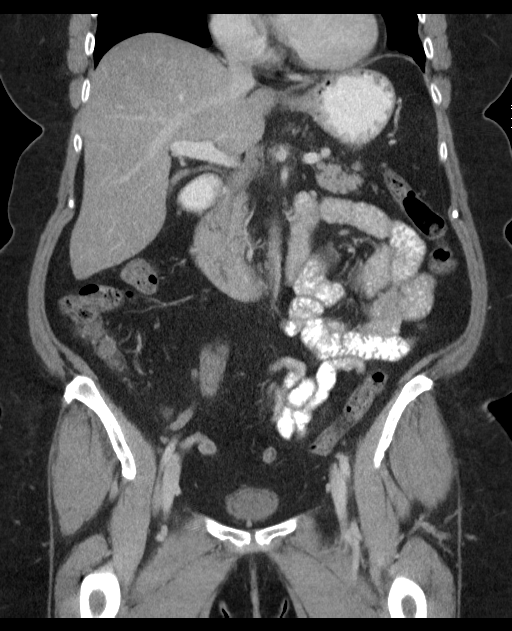
[im 52/93  soft-tissue]
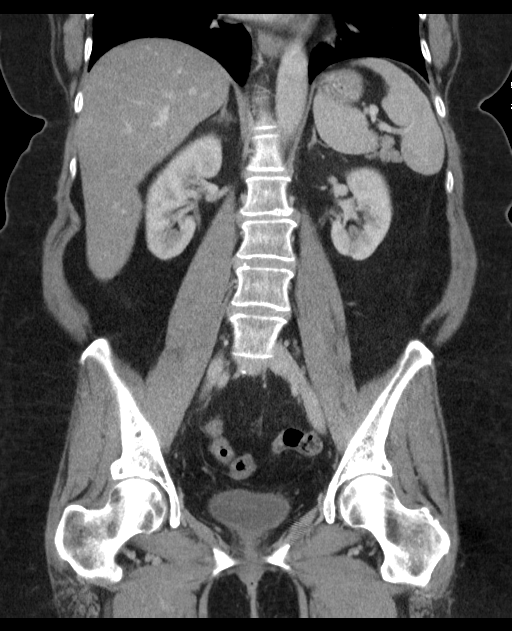

[16 of 46 positions shown; findings below may reference images not displayed]

FINDINGS: Lower chest: The lung bases are clear. The heart is within normal
limits in size.

Hepatobiliary: The liver enhances with no focal abnormality and no
ductal dilatation is seen. No calcified gallstones are noted.

Pancreas: The pancreas is normal in size and the pancreatic duct is
not dilated.

Spleen: The spleen is unremarkable.

Adrenals/Urinary Tract: The adrenal glands appear normal. The
kidneys enhance with no calculus or mass and on delayed images, the
pelvocaliceal systems are unremarkable. The ureters are normal in
caliber. The urinary bladder is not well distended but no
abnormality is seen.

Stomach/Bowel: The stomach is minimally distended with oral contrast
and food debris. No small bowel distention is seen. There scattered
rectosigmoid colon diverticula but no diverticulitis is seen. The
terminal ileum is unremarkable. The appendix is visualized better on
the coronal images and there is no evidence of acute appendicitis.
The maximum diameter of the appendix is 7 mm which is within upper
limits of normal. No surrounding inflammation is seen.

Vascular/Lymphatic: The abdominal aorta is normal in caliber. No
adenopathy is seen. Small mesenteric nodes are present particular
near the porta hepatis.

Reproductive: The uterus has previously been resected. No adnexal
lesion is seen. No fluid is noted within the pelvis.

Other: None

Musculoskeletal: The lumbar vertebrae are in normal alignment with
normal intervertebral disc spaces. The SI joints are corticated.
IMPRESSION: 1. No explanation for the patient's right lower quadrant pain is
seen. The appendix and terminal ileum are unremarkable with no
inflammatory process noted.
2. No renal calculi are seen.

## 2018-05-05 ENCOUNTER — Other Ambulatory Visit: Payer: Self-pay | Admitting: Internal Medicine

## 2018-05-05 DIAGNOSIS — Z1231 Encounter for screening mammogram for malignant neoplasm of breast: Secondary | ICD-10-CM

## 2018-05-11 ENCOUNTER — Ambulatory Visit (INDEPENDENT_AMBULATORY_CARE_PROVIDER_SITE_OTHER): Payer: BLUE CROSS/BLUE SHIELD | Admitting: *Deleted

## 2018-05-11 DIAGNOSIS — Z23 Encounter for immunization: Secondary | ICD-10-CM | POA: Diagnosis not present

## 2018-07-05 ENCOUNTER — Ambulatory Visit
Admission: RE | Admit: 2018-07-05 | Discharge: 2018-07-05 | Disposition: A | Discharge status: Home / Self Care | Payer: BLUE CROSS/BLUE SHIELD | Source: Ambulatory Visit | Attending: Internal Medicine | Admitting: Internal Medicine

## 2018-07-05 DIAGNOSIS — Z1231 Encounter for screening mammogram for malignant neoplasm of breast: Secondary | ICD-10-CM | POA: Diagnosis not present

## 2018-07-06 ENCOUNTER — Other Ambulatory Visit: Payer: Self-pay | Admitting: Internal Medicine

## 2018-07-06 DIAGNOSIS — R928 Other abnormal and inconclusive findings on diagnostic imaging of breast: Secondary | ICD-10-CM

## 2018-07-08 ENCOUNTER — Ambulatory Visit
Admission: RE | Admit: 2018-07-08 | Discharge: 2018-07-08 | Disposition: A | Discharge status: Home / Self Care | Payer: BLUE CROSS/BLUE SHIELD | Source: Ambulatory Visit | Attending: Internal Medicine | Admitting: Internal Medicine

## 2018-07-08 ENCOUNTER — Ambulatory Visit: Payer: BLUE CROSS/BLUE SHIELD

## 2018-07-08 DIAGNOSIS — R928 Other abnormal and inconclusive findings on diagnostic imaging of breast: Secondary | ICD-10-CM

## 2018-07-08 DIAGNOSIS — R922 Inconclusive mammogram: Secondary | ICD-10-CM | POA: Diagnosis not present

## 2018-07-12 ENCOUNTER — Other Ambulatory Visit: Payer: BLUE CROSS/BLUE SHIELD

## 2018-08-17 DIAGNOSIS — Q141 Congenital malformation of retina: Secondary | ICD-10-CM | POA: Diagnosis not present

## 2018-08-17 DIAGNOSIS — H04123 Dry eye syndrome of bilateral lacrimal glands: Secondary | ICD-10-CM | POA: Diagnosis not present

## 2018-08-17 DIAGNOSIS — H2513 Age-related nuclear cataract, bilateral: Secondary | ICD-10-CM | POA: Diagnosis not present

## 2018-09-28 NOTE — Progress Notes (Signed)
68 y.o. G0P0000 Married White or Caucasian Not Hispanic or Latino female here for annual exam.  H/O TAH/BSO. Not sexually active. No bowel or bladder issues.  She is loving retirement, very busy. She and her husband travel a lot in the [place].     No LMP recorded. Patient has had a hysterectomy.          Sexually active: No.  The current method of family planning is status post hysterectomy.    Exercising: Yes.    walking Smoker:  no  Health Maintenance: Pap:  unsure History of abnormal Pap:  No MMG: 07/05/2018 Birads 0, 07/08/2018 diagnostic right Birads 1, screening due 1 year Colonoscopy:  09-29-14 polyps repeat in 5 yrs  BMD:   06-29-17 Osteopenia, T score -1.6, FRAX 13.3/1.2 TDaP:  2011 Gardasil: N/A   reports that she quit smoking about 44 years ago. She has never used smokeless tobacco. She reports current alcohol use of about 3.0 standard drinks of alcohol per week. She reports that she does not use drugs. 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 No children. Married for 40 years. Husband still working.    Past Medical History:  Diagnosis Date  . ALLERGIC RHINITIS 04/04/2007  . BURSITIS, RIGHT HIP 04/04/2007  . COLONIC POLYPS, HX OF 04/04/2007  . FATIGUE 05/12/2007  . HYPERLIPIDEMIA 04/04/2007  . LATERAL EPICONDYLITIS, RIGHT 03/13/2010  . 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 OSTEOARTHRITIS 04/04/2007  . OSTEOARTHRITIS, KNEE, RIGHT 02/12/2009  . OSTEOPENIA 03/13/2010  . Palpitations 05/12/2007  . Unspecified vitamin D deficiency 02/12/2009    Past Surgical History:  Procedure Laterality Date  . ABDOMINAL HYSTERECTOMY  2002  . BLEPHAROPLASTY Bilateral 2015  . COLONOSCOPY    . COSMETIC SURGERY  2014   eye lift  . hemorrhoidectomoy  1988  . kidney stone removal  2005  . OOPHORECTOMY  2002    Current Outpatient Medications  Medication Sig Dispense Refill  . atorvastatin (LIPITOR) 80 MG tablet Take 1 tablet (80 mg total) by mouth daily. 90 tablet 3  . Calcium Carb-Cholecalciferol  (CALCIUM 500 + D3 PO) Take by mouth.    . Cholecalciferol (VITAMIN D) 2000 units tablet Take 1 tablet (2,000 Units total) by mouth daily. 30 tablet 11  . Glucosamine-Chondroitin (MOVE FREE PO) Take by mouth.    . Magnesium Oxide 250 MG TABS Take 250 mg by mouth daily.    . vitamin C (ASCORBIC ACID) 500 MG tablet Take 500 mg by mouth daily.     No current facility-administered medications for this visit.     Family History  Problem Relation Age of Onset  . Gastric cancer Paternal Grandfather   . Heart disease Mother        died at 98yo  . Heart disease Father        died at 35yo  . Colon cancer Neg Hx     Review of Systems  Constitutional: Negative.   HENT: Negative.   Eyes: Negative.   Respiratory: Negative.   Cardiovascular: Negative.   Gastrointestinal: Negative.   Endocrine: Negative.   Genitourinary: Negative.        Night urination once a night, this is normal for patient  Musculoskeletal: Negative.   Skin: Negative.   Allergic/Immunologic: Negative.   Neurological: Negative.   Hematological: Negative.   Psychiatric/Behavioral: Negative.     Exam:   BP 140/82 (BP Location: Right Arm, Patient Position: Sitting, Cuff Size: Normal)   Pulse 68   Ht 5' 3.39" (1.61 m)   Wt  181 lb 3.2 oz (82.2 kg)   BMI 31.71 kg/m   Weight change: @WEIGHTCHANGE @ Height:   Height: 5' 3.39" (161 cm)  Ht Readings from Last 3 Encounters:  09/30/18 5' 3.39" (1.61 m)  10/13/17 5' 3.25" (1.607 m)  09/03/17 5' 3.25" (1.607 m)    General appearance: alert, cooperative and appears stated age Head: Normocephalic, without obvious abnormality, atraumatic Neck: no adenopathy, supple, symmetrical, trachea midline and thyroid normal to inspection and palpation Lungs: clear to auscultation bilaterally Cardiovascular: regular rate and rhythm Breasts: normal appearance, no masses or tenderness Abdomen: soft, non-tender; non distended,  no masses,  no organomegaly Extremities: extremities normal,  atraumatic, no cyanosis or edema Skin: Skin color, texture, turgor normal. No rashes or lesions Lymph nodes: Cervical, supraclavicular, and axillary nodes normal. No abnormal inguinal nodes palpated Neurologic: Grossly normal   Pelvic: External genitalia:  no lesions              Urethra:  normal appearing urethra with no masses, tenderness or lesions              Bartholins and Skenes: normal                 Vagina: atrophic appearing vagina with normal color and discharge, no lesions              Cervix: absent               Bimanual Exam:  Uterus:  uterus absent              Adnexa: no mass, fullness, tenderness               Rectovaginal: Confirms               Anus:  normal sphincter tone, no lesions  Chaperone was present for exam.  A:  Well Woman with normal exam  Osteopenia  P:   No pap needed  Mammogram UTD, dense breasts, recommended 3D  Colonoscopy next year  DEXA in 11/20  Discussed breast self exam  Discussed calcium and vit D intake  Labs with primary

## 2018-09-30 ENCOUNTER — Ambulatory Visit (INDEPENDENT_AMBULATORY_CARE_PROVIDER_SITE_OTHER): Payer: BLUE CROSS/BLUE SHIELD | Admitting: Obstetrics and Gynecology

## 2018-09-30 ENCOUNTER — Encounter: Payer: Self-pay | Admitting: Obstetrics and Gynecology

## 2018-09-30 ENCOUNTER — Other Ambulatory Visit: Payer: Self-pay

## 2018-09-30 VITALS — BP 140/82 | HR 68 | Ht 63.39 in | Wt 181.2 lb

## 2018-09-30 DIAGNOSIS — Z01419 Encounter for gynecological examination (general) (routine) without abnormal findings: Secondary | ICD-10-CM | POA: Diagnosis not present

## 2018-09-30 DIAGNOSIS — M8580 Other specified disorders of bone density and structure, unspecified site: Secondary | ICD-10-CM | POA: Diagnosis not present

## 2018-09-30 NOTE — Patient Instructions (Signed)
EXERCISE AND DIET:  We recommended that you start or continue a regular exercise program for good health. Regular exercise means any activity that makes your heart beat faster and makes you sweat.  We recommend exercising at least 30 minutes per day at least 3 days a week, preferably 4 or 5.  We also recommend a diet low in fat and sugar.  Inactivity, poor dietary choices and obesity can cause diabetes, heart attack, stroke, and kidney damage, among others.    ALCOHOL AND SMOKING:  Women should limit their alcohol intake to no more than 7 drinks/beers/glasses of wine (combined, not each!) per week. Moderation of alcohol intake to this level decreases your risk of breast cancer and liver damage. And of course, no recreational drugs are part of a healthy lifestyle.  And absolutely no smoking or even second hand smoke. Most people know smoking can cause heart and lung diseases, but did you know it also contributes to weakening of your bones? Aging of your skin?  Yellowing of your teeth and nails?  CALCIUM AND VITAMIN D:  Adequate intake of calcium and Vitamin D are recommended.  The recommendations for exact amounts of these supplements seem to change often, but generally speaking 1,200 mg of calcium (between diet and supplement) and 800 units of Vitamin D per day seems prudent. Certain women may benefit from higher intake of Vitamin D.  If you are among these women, your doctor will have told you during your visit.    PAP SMEARS:  Pap smears, to check for cervical cancer or precancers,  have traditionally been done yearly, although recent scientific advances have shown that most women can have pap smears less often.  However, every woman still should have a physical exam from her gynecologist every year. It will include a breast check, inspection of the vulva and vagina to check for abnormal growths or skin changes, a visual exam of the cervix, and then an exam to evaluate the size and shape of the uterus and  ovaries.  And after 66 years of age, a rectal exam is indicated to check for rectal cancers. We will also provide age appropriate advice regarding health maintenance, like when you should have certain vaccines, screening for sexually transmitted diseases, bone density testing, colonoscopy, mammograms, etc.   MAMMOGRAMS:  All women over 40 years old should have a yearly mammogram. Many facilities now offer a "3D" mammogram, which may cost around $50 extra out of pocket. If possible,  we recommend you accept the option to have the 3D mammogram performed.  It both reduces the number of women who will be called back for extra views which then turn out to be normal, and it is better than the routine mammogram at detecting truly abnormal areas.    COLON CANCER SCREENING: Now recommend starting at age 45. At this time colonoscopy is not covered for routine screening until 50. There are take home tests that can be done between 45-49.   COLONOSCOPY:  Colonoscopy to screen for colon cancer is recommended for all women at age 50.  We know, you hate the idea of the prep.  We agree, BUT, having colon cancer and not knowing it is worse!!  Colon cancer so often starts as a polyp that can be seen and removed at colonscopy, which can quite literally save your life!  And if your first colonoscopy is normal and you have no family history of colon cancer, most women don't have to have it again for   10 years.  Once every ten years, you can do something that may end up saving your life, right?  We will be happy to help you get it scheduled when you are ready.  Be sure to check your insurance coverage so you understand how much it will cost.  It may be covered as a preventative service at no cost, but you should check your particular policy.      Breast Self-Awareness Breast self-awareness means being familiar with how your breasts look and feel. It involves checking your breasts regularly and reporting any changes to your  health care provider. Practicing breast self-awareness is important. A change in your breasts can be a sign of a serious medical problem. Being familiar with how your breasts look and feel allows you to find any problems early, when treatment is more likely to be successful. All women should practice breast self-awareness, including women who have had breast implants. How to do a breast self-exam One way to learn what is normal for your breasts and whether your breasts are changing is to do a breast self-exam. To do a breast self-exam: Look for Changes  1. Remove all the clothing above your waist. 2. Stand in front of a mirror in a room with good lighting. 3. Put your hands on your hips. 4. Push your hands firmly downward. 5. Compare your breasts in the mirror. Look for differences between them (asymmetry), such as: ? Differences in shape. ? Differences in size. ? Puckers, dips, and bumps in one breast and not the other. 6. Look at each breast for changes in your skin, such as: ? Redness. ? Scaly areas. 7. Look for changes in your nipples, such as: ? Discharge. ? Bleeding. ? Dimpling. ? Redness. ? A change in position. Feel for Changes Carefully feel your breasts for lumps and changes. It is best to do this while lying on your back on the floor and again while sitting or standing in the shower or tub with soapy water on your skin. Feel each breast in the following way:  Place the arm on the side of the breast you are examining above your head.  Feel your breast with the other hand.  Start in the nipple area and make  inch (2 cm) overlapping circles to feel your breast. Use the pads of your three middle fingers to do this. Apply light pressure, then medium pressure, then firm pressure. The light pressure will allow you to feel the tissue closest to the skin. The medium pressure will allow you to feel the tissue that is a little deeper. The firm pressure will allow you to feel the tissue  close to the ribs.  Continue the overlapping circles, moving downward over the breast until you feel your ribs below your breast.  Move one finger-width toward the center of the body. Continue to use the  inch (2 cm) overlapping circles to feel your breast as you move slowly up toward your collarbone.  Continue the up and down exam using all three pressures until you reach your armpit.  Write Down What You Find  Write down what is normal for each breast and any changes that you find. Keep a written record with breast changes or normal findings for each breast. By writing this information down, you do not need to depend only on memory for size, tenderness, or location. Write down where you are in your menstrual cycle, if you are still menstruating. If you are having trouble noticing differences   in your breasts, do not get discouraged. With time you will become more familiar with the variations in your breasts and more comfortable with the exam. How often should I examine my breasts? Examine your breasts every month. If you are breastfeeding, the best time to examine your breasts is after a feeding or after using a breast pump. If you menstruate, the best time to examine your breasts is 5-7 days after your period is over. During your period, your breasts are lumpier, and it may be more difficult to notice changes. When should I see my health care provider? See your health care provider if you notice:  A change in shape or size of your breasts or nipples.  A change in the skin of your breast or nipples, such as a reddened or scaly area.  Unusual discharge from your nipples.  A lump or thick area that was not there before.  Pain in your breasts.  Anything that concerns you.  

## 2018-10-15 ENCOUNTER — Other Ambulatory Visit: Payer: Self-pay | Admitting: Obstetrics and Gynecology

## 2018-10-15 ENCOUNTER — Encounter: Payer: BLUE CROSS/BLUE SHIELD | Admitting: Internal Medicine

## 2018-10-15 DIAGNOSIS — M8580 Other specified disorders of bone density and structure, unspecified site: Secondary | ICD-10-CM

## 2018-10-21 ENCOUNTER — Encounter: Payer: BLUE CROSS/BLUE SHIELD | Admitting: Internal Medicine

## 2018-11-04 ENCOUNTER — Encounter: Payer: BLUE CROSS/BLUE SHIELD | Admitting: Internal Medicine

## 2018-11-10 ENCOUNTER — Encounter: Payer: BLUE CROSS/BLUE SHIELD | Admitting: Internal Medicine

## 2018-12-20 ENCOUNTER — Telehealth: Payer: Self-pay

## 2018-12-20 ENCOUNTER — Other Ambulatory Visit: Payer: Self-pay | Admitting: Internal Medicine

## 2018-12-20 ENCOUNTER — Other Ambulatory Visit (INDEPENDENT_AMBULATORY_CARE_PROVIDER_SITE_OTHER): Payer: BLUE CROSS/BLUE SHIELD

## 2018-12-20 DIAGNOSIS — Z0000 Encounter for general adult medical examination without abnormal findings: Secondary | ICD-10-CM

## 2018-12-20 DIAGNOSIS — R739 Hyperglycemia, unspecified: Secondary | ICD-10-CM | POA: Diagnosis not present

## 2018-12-20 LAB — LIPID PANEL
Cholesterol: 186 mg/dL (ref 0–200)
HDL: 56.2 mg/dL (ref >39.00)
LDL Cholesterol: 110 mg/dL — ABNORMAL HIGH (ref 0–99)
NonHDL: 129.83
Total CHOL/HDL Ratio: 3
Triglycerides: 99 mg/dL (ref 0.0–149.0)
VLDL: 19.8 mg/dL (ref 0.0–40.0)

## 2018-12-20 LAB — HEPATIC FUNCTION PANEL
ALT: 21 U/L (ref 0–35)
AST: 21 U/L (ref 0–37)
Albumin: 4.1 g/dL (ref 3.5–5.2)
Alkaline Phosphatase: 100 U/L (ref 39–117)
Bilirubin, Direct: 0.1 mg/dL (ref 0.0–0.3)
Total Bilirubin: 0.8 mg/dL (ref 0.2–1.2)
Total Protein: 7 g/dL (ref 6.0–8.3)

## 2018-12-20 LAB — CBC WITH DIFFERENTIAL/PLATELET
Basophils Absolute: 0 10*3/uL (ref 0.0–0.1)
Basophils Relative: 0.6 % (ref 0.0–3.0)
Eosinophils Absolute: 0.1 10*3/uL (ref 0.0–0.7)
Eosinophils Relative: 1.2 % (ref 0.0–5.0)
HCT: 39.3 % (ref 36.0–46.0)
Hemoglobin: 13.5 g/dL (ref 12.0–15.0)
Lymphocytes Relative: 15.4 % (ref 12.0–46.0)
Lymphs Abs: 0.9 10*3/uL (ref 0.7–4.0)
MCHC: 34.4 g/dL (ref 30.0–36.0)
MCV: 86.4 fl (ref 78.0–100.0)
Monocytes Absolute: 0.6 10*3/uL (ref 0.1–1.0)
Monocytes Relative: 9.8 % (ref 3.0–12.0)
Neutro Abs: 4.3 10*3/uL (ref 1.4–7.7)
Neutrophils Relative %: 73 % (ref 43.0–77.0)
Platelets: 212 10*3/uL (ref 150.0–400.0)
RBC: 4.55 Mil/uL (ref 3.87–5.11)
RDW: 13.4 % (ref 11.5–15.5)
WBC: 5.9 10*3/uL (ref 4.0–10.5)

## 2018-12-20 LAB — URINALYSIS, ROUTINE W REFLEX MICROSCOPIC
Bilirubin Urine: NEGATIVE
Hgb urine dipstick: NEGATIVE
Ketones, ur: NEGATIVE
Leukocytes,Ua: NEGATIVE
Nitrite: NEGATIVE
Specific Gravity, Urine: 1.02 (ref 1.000–1.030)
Total Protein, Urine: NEGATIVE
Urine Glucose: NEGATIVE
Urobilinogen, UA: 0.2 (ref 0.0–1.0)
pH: 6.5 (ref 5.0–8.0)

## 2018-12-20 LAB — BASIC METABOLIC PANEL
BUN: 15 mg/dL (ref 6–23)
CO2: 28 mEq/L (ref 19–32)
Calcium: 9.5 mg/dL (ref 8.4–10.5)
Chloride: 105 mEq/L (ref 96–112)
Creatinine, Ser: 0.67 mg/dL (ref 0.40–1.20)
GFR: 88.18 mL/min (ref >60.00)
Glucose, Bld: 99 mg/dL (ref 70–99)
Potassium: 4.5 mEq/L (ref 3.5–5.1)
Sodium: 141 mEq/L (ref 135–145)

## 2018-12-20 LAB — HEMOGLOBIN A1C: Hgb A1c MFr Bld: 5.5 % (ref 4.6–6.5)

## 2018-12-20 LAB — TSH: TSH: 1.84 u[IU]/mL (ref 0.35–4.50)

## 2018-12-20 NOTE — Telephone Encounter (Signed)
Patient advised, she is doing lab work now

## 2018-12-20 NOTE — Telephone Encounter (Signed)
Ok this is done 

## 2018-12-20 NOTE — Telephone Encounter (Signed)
Routing to dr Jenny Reichmann, patient has office visit with you next week---not sure if you will be doing this appointment in office or virtual/video---but do you need patient to come in for lab work?  Please advise, I will call patient back, thanks

## 2018-12-30 ENCOUNTER — Ambulatory Visit (INDEPENDENT_AMBULATORY_CARE_PROVIDER_SITE_OTHER): Payer: BLUE CROSS/BLUE SHIELD | Admitting: Internal Medicine

## 2018-12-30 ENCOUNTER — Encounter: Payer: Self-pay | Admitting: Internal Medicine

## 2018-12-30 ENCOUNTER — Other Ambulatory Visit: Payer: Self-pay

## 2018-12-30 VITALS — BP 118/76 | HR 89 | Temp 98.3°F | Ht 63.0 in | Wt 178.0 lb

## 2018-12-30 DIAGNOSIS — E538 Deficiency of other specified B group vitamins: Secondary | ICD-10-CM

## 2018-12-30 DIAGNOSIS — E611 Iron deficiency: Secondary | ICD-10-CM | POA: Diagnosis not present

## 2018-12-30 DIAGNOSIS — R238 Other skin changes: Secondary | ICD-10-CM | POA: Insufficient documentation

## 2018-12-30 DIAGNOSIS — M8580 Other specified disorders of bone density and structure, unspecified site: Secondary | ICD-10-CM

## 2018-12-30 DIAGNOSIS — Z23 Encounter for immunization: Secondary | ICD-10-CM | POA: Diagnosis not present

## 2018-12-30 DIAGNOSIS — R739 Hyperglycemia, unspecified: Secondary | ICD-10-CM | POA: Diagnosis not present

## 2018-12-30 DIAGNOSIS — E559 Vitamin D deficiency, unspecified: Secondary | ICD-10-CM

## 2018-12-30 DIAGNOSIS — R233 Spontaneous ecchymoses: Secondary | ICD-10-CM

## 2018-12-30 DIAGNOSIS — Z0000 Encounter for general adult medical examination without abnormal findings: Secondary | ICD-10-CM

## 2018-12-30 DIAGNOSIS — Z0001 Encounter for general adult medical examination with abnormal findings: Secondary | ICD-10-CM | POA: Diagnosis not present

## 2018-12-30 DIAGNOSIS — Z832 Family history of diseases of the blood and blood-forming organs and certain disorders involving the immune mechanism: Secondary | ICD-10-CM

## 2018-12-30 MED ORDER — ATORVASTATIN CALCIUM 80 MG PO TABS: 80.0000 mg | ORAL_TABLET | Freq: Every day | ORAL | 3 refills | Status: DC

## 2018-12-30 MED ORDER — ZOSTER VAC RECOMB ADJUVANTED 50 MCG/0.5ML IM SUSR: 0.5000 mL | Freq: Once | INTRAMUSCULAR | 1 refills | Status: AC

## 2018-12-30 NOTE — Progress Notes (Signed)
Subjective:    Patient ID: [name], female    DOB: 1951-06-05, 68 y.o.   MRN: [id number]  HPI   Here for wellness and f/u;  Overall doing ok;  Pt denies Chest pain, worsening SOB, DOE, wheezing, orthopnea, PND, worsening LE edema, palpitations, dizziness or syncope.  Pt denies neurological change such as new headache, facial or extremity weakness.  Pt denies polydipsia, polyuria, or low sugar symptoms. Pt states overall good compliance with treatment and medications, good tolerability, and has been trying to follow appropriate diet.  Pt denies worsening depressive symptoms, suicidal ideation or panic. No fever, night sweats, wt loss, loss of appetite, or other constitutional symptoms.  Pt states good ability with ADL's, has low fall risk, home safety reviewed and adequate, no other significant changes in hearing or vision, and only occasionally active with exercise.  Due for prevnar 13, shingrix.  Admits to only taking lipitor 80 mg qod hoping a better diet would mean the need for less med.  Also c/o bruising easy to left hand most recent but other sites in last few months, just now healing it seems, did just restart asa.  Very concerned as her mother has diagnosed prot c deficiency and mother and sister with clotting disorders  Nothing makes better or worse Wt Readings from Last 3 Encounters:  12/30/18 178 lb (80.7 kg)  09/30/18 181 lb 3.2 oz (82.2 kg)  10/13/17 182 lb (82.6 kg)   BP Readings from Last 3 Encounters:  12/30/18 118/76  09/30/18 140/82  10/13/17 114/78  Has DXA ordered per GYN.   Past Medical History:  Diagnosis Date  . ALLERGIC RHINITIS 04/04/2007  . BURSITIS, RIGHT HIP 04/04/2007  . COLONIC POLYPS, HX OF 04/04/2007  . FATIGUE 05/12/2007  . HYPERLIPIDEMIA 04/04/2007  . LATERAL EPICONDYLITIS, RIGHT 03/13/2010  . 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 OSTEOARTHRITIS 04/04/2007  . OSTEOARTHRITIS, KNEE, RIGHT 02/12/2009  . OSTEOPENIA 03/13/2010  . Palpitations 05/12/2007  . Unspecified  vitamin D deficiency 02/12/2009   Past Surgical History:  Procedure Laterality Date  . ABDOMINAL HYSTERECTOMY  2002  . BLEPHAROPLASTY Bilateral 2015  . COLONOSCOPY    . COSMETIC SURGERY  2014   eye lift  . hemorrhoidectomoy  1988  . kidney stone removal  2005  . OOPHORECTOMY  2002    reports that she quit smoking about 44 years ago. She has never used smokeless tobacco. She reports current alcohol use of about 3.0 standard drinks of alcohol per week. She reports that she does not use drugs. family history includes Gastric cancer in her paternal grandfather; Heart disease in her father and mother. Allergies  Allergen Reactions  . Sulfonamide Derivatives Swelling   Current Outpatient Medications on File Prior to Visit  Medication Sig Dispense Refill  . Calcium Carb-Cholecalciferol (CALCIUM 500 + D3 PO) Take by mouth.    . Cholecalciferol (VITAMIN D) 2000 units tablet Take 1 tablet (2,000 Units total) by mouth daily. 30 tablet 11  . Glucosamine-Chondroitin (MOVE FREE PO) Take by mouth.    . Magnesium Oxide 250 MG TABS Take 250 mg by mouth daily.    . vitamin C (ASCORBIC ACID) 500 MG tablet Take 500 mg by mouth daily.     No current facility-administered medications on file prior to visit.    Review of Systems Constitutional: Negative for other unusual diaphoresis, sweats, appetite or weight changes HENT: Negative for other worsening hearing loss, ear pain, facial swelling, mouth sores or neck stiffness.   Eyes: Negative for  other worsening pain, redness or other visual disturbance.  Respiratory: Negative for other stridor or swelling Cardiovascular: Negative for other palpitations or other chest pain  Gastrointestinal: Negative for worsening diarrhea or loose stools, blood in stool, distention or other pain Genitourinary: Negative for hematuria, flank pain or other change in urine volume.  Musculoskeletal: Negative for myalgias or other joint swelling.  Skin: Negative for other  color change, or other wound or worsening drainage.  Neurological: Negative for other syncope or numbness. Hematological: Negative for other adenopathy or swelling Psychiatric/Behavioral: Negative for hallucinations, other worsening agitation, SI, self-injury, or new decreased concentration All other system neg per pt    Objective:   Physical Exam BP 118/76   Pulse 89   Temp 98.3 F (36.8 C) (Oral)   Ht 5\' 3"  (1.6 m)   Wt 178 lb (80.7 kg)   SpO2 99%   BMI 31.53 kg/m  VS noted,  Constitutional: Pt is oriented to person, place, and time. Appears well-developed and well-nourished, in no significant distress and comfortable Head: Normocephalic and atraumatic  Eyes: Conjunctivae and EOM are normal. Pupils are equal, round, and reactive to light Right Ear: External ear normal without discharge Left Ear: External ear normal without discharge Nose: Nose without discharge or deformity Mouth/Throat: Oropharynx is without other ulcerations and moist  Neck: Normal range of motion. Neck supple. No JVD present. No tracheal deviation present or significant neck LA or mass Cardiovascular: Normal rate, regular rhythm, normal heart sounds and intact distal pulses.   Pulmonary/Chest: WOB normal and breath sounds without rales or wheezing  Abdominal: Soft. Bowel sounds are normal. NT. No HSM  Musculoskeletal: Normal range of motion. Exhibits no edema Lymphadenopathy: Has no other cervical adenopathy.  Neurological: Pt is alert and oriented to person, place, and time. Pt has normal reflexes. No cranial nerve deficit. Motor grossly intact, Gait intact Skin: Skin is warm and dry. No rash noted or new ulcerations, has large bruising possible less than 1 wk old to post left hand Psychiatric:  Has normal mood and affect. Behavior is normal without agitation No other exam findings Lab Results  Component Value Date   WBC 5.9 12/20/2018   HGB 13.5 12/20/2018   HCT 39.3 12/20/2018   PLT 212.0 12/20/2018    GLUCOSE 99 12/20/2018   CHOL 186 12/20/2018   TRIG 99.0 12/20/2018   HDL 56.20 12/20/2018   LDLDIRECT 162.0 03/06/2010   LDLCALC 110 (H) 12/20/2018   ALT 21 12/20/2018   AST 21 12/20/2018   NA 141 12/20/2018   K 4.5 12/20/2018   CL 105 12/20/2018   CREATININE 0.67 12/20/2018   BUN 15 12/20/2018   CO2 28 12/20/2018   TSH 1.84 12/20/2018   HGBA1C 5.5 12/20/2018       Assessment & Plan:

## 2018-12-30 NOTE — Patient Instructions (Addendum)
You had the Prevnar 13 pneumonia shot today  Your shingles shot prescription was sent to you pharmacy  Please take the lipitor daily instead of every other day  Please continue all other medications as before, and refills have been done if requested.  Please have the pharmacy call with any other refills you may need.  Please continue your efforts at being more active, low cholesterol diet, and weight control.  You are otherwise up to date with prevention measures today.  Please keep your appointments with your specialists as you may have planned  Please go to the LAB in the Basement (turn left off the elevator) for the tests to be done in 3 weeks as planned  You will be contacted by phone if any changes need to be made immediately.  Otherwise, you will receive a letter about your results with an explanation, but please check with MyChart first.  Please remember to sign up for MyChart if you have not done so, as this will be important to you in the future with finding out test results, communicating by private email, and scheduling acute appointments online when needed.  Please return in 1 year for your yearly visit, or sooner if needed, with Lab testing done 3-5 days before

## 2019-01-01 ENCOUNTER — Encounter: Payer: Self-pay | Admitting: Internal Medicine

## 2019-01-01 NOTE — Assessment & Plan Note (Signed)
Also for vit d level,  to f/u any worsening symptoms or concerns

## 2019-01-01 NOTE — Assessment & Plan Note (Signed)
Also for hypercoagulable panel,  to f/u any worsening symptoms or concerns

## 2019-01-01 NOTE — Assessment & Plan Note (Signed)
To f/u dxa with GYN

## 2019-01-01 NOTE — Assessment & Plan Note (Addendum)
Ok for PT/INR, cont asa,  to f/u any worsening symptoms or concerns  In addition to the time spent performing CPE, I spent an additional 15 minutes face to face,in which greater than 50% of this time was spent in counseling and coordination of care for patient's illness as documented, including the differential dx, treatment, further evaluation and other management of abnormal bruising, FH hypercoagualability, hyperglycemia, osteopenia, vit d deficiency

## 2019-01-01 NOTE — Assessment & Plan Note (Signed)

## 2019-01-01 NOTE — Assessment & Plan Note (Signed)
stable overall by history and exam, recent data reviewed with pt, and pt to continue medical treatment as before,  to f/u any worsening symptoms or concerns  

## 2019-01-17 ENCOUNTER — Encounter: Payer: Self-pay | Admitting: Internal Medicine

## 2019-01-18 ENCOUNTER — Other Ambulatory Visit (INDEPENDENT_AMBULATORY_CARE_PROVIDER_SITE_OTHER): Payer: BC Managed Care – PPO

## 2019-01-18 DIAGNOSIS — E611 Iron deficiency: Secondary | ICD-10-CM | POA: Diagnosis not present

## 2019-01-18 DIAGNOSIS — R238 Other skin changes: Secondary | ICD-10-CM | POA: Diagnosis not present

## 2019-01-18 DIAGNOSIS — Z832 Family history of diseases of the blood and blood-forming organs and certain disorders involving the immune mechanism: Secondary | ICD-10-CM | POA: Diagnosis not present

## 2019-01-18 DIAGNOSIS — E538 Deficiency of other specified B group vitamins: Secondary | ICD-10-CM | POA: Diagnosis not present

## 2019-01-18 DIAGNOSIS — E559 Vitamin D deficiency, unspecified: Secondary | ICD-10-CM

## 2019-01-18 DIAGNOSIS — R233 Spontaneous ecchymoses: Secondary | ICD-10-CM

## 2019-01-18 LAB — VITAMIN B12: Vitamin B-12: 482 pg/mL (ref 211–911)

## 2019-01-18 LAB — IBC PANEL
Iron: 88 ug/dL (ref 42–145)
Saturation Ratios: 24.6 % (ref 20.0–50.0)
Transferrin: 256 mg/dL (ref 212.0–360.0)

## 2019-01-18 LAB — PROTIME-INR
INR: 1 ratio (ref 0.8–1.0)
Prothrombin Time: 11.7 s (ref 9.6–13.1)

## 2019-01-18 LAB — VITAMIN D 25 HYDROXY (VIT D DEFICIENCY, FRACTURES): VITD: 62.01 ng/mL (ref 30.00–100.00)

## 2019-01-18 NOTE — Addendum Note (Signed)
Addended by: Raliegh Ip on: 01/18/2019 08:12 AM   Modules accepted: Orders

## 2019-02-02 LAB — HYPERCOAGULABLE PANEL, COMPREHENSIVE
APTT: 24.1 s
AT III Act/Nor PPP Chro: 128 %
Act. Prt C Resist w/FV Defic.: 2.9 ratio
Anticardiolipin Ab, IgG: <10 [GPL'U]
Anticardiolipin Ab, IgM: <10 [MPL'U]
Beta-2 Glycoprotein I, IgA: <10 SAU
Beta-2 Glycoprotein I, IgG: <10 SGU
Beta-2 Glycoprotein I, IgM: <10 SMU
DRVVT Screen Seconds: 38 s
Factor VII Antigen**: 155 %
Factor VIII Activity: 204 % — ABNORMAL HIGH
Hexagonal Phospholipid Neutral: 0 s
Homocysteine: 8.3 umol/L
Prot C Ag Act/Nor PPP Imm: 137 %
Prot S Ag Act/Nor PPP Imm: 158 % — ABNORMAL HIGH
Protein C Ag/FVII Ag Ratio**: 0.9 ratio
Protein S Ag/FVII Ag Ratio**: 1 ratio

## 2019-03-31 ENCOUNTER — Ambulatory Visit (INDEPENDENT_AMBULATORY_CARE_PROVIDER_SITE_OTHER): Payer: BC Managed Care – PPO

## 2019-03-31 ENCOUNTER — Other Ambulatory Visit: Payer: Self-pay

## 2019-03-31 DIAGNOSIS — Z23 Encounter for immunization: Secondary | ICD-10-CM | POA: Diagnosis not present

## 2019-06-21 ENCOUNTER — Other Ambulatory Visit: Payer: Self-pay | Admitting: Internal Medicine

## 2019-06-21 DIAGNOSIS — Z1231 Encounter for screening mammogram for malignant neoplasm of breast: Secondary | ICD-10-CM

## 2019-07-06 ENCOUNTER — Ambulatory Visit
Admission: RE | Admit: 2019-07-06 | Discharge: 2019-07-06 | Disposition: A | Discharge status: Home / Self Care | Payer: BC Managed Care – PPO | Source: Ambulatory Visit | Attending: Obstetrics and Gynecology | Admitting: Obstetrics and Gynecology

## 2019-07-06 ENCOUNTER — Other Ambulatory Visit: Payer: Self-pay

## 2019-07-06 DIAGNOSIS — M8589 Other specified disorders of bone density and structure, multiple sites: Secondary | ICD-10-CM | POA: Diagnosis not present

## 2019-07-06 DIAGNOSIS — M8580 Other specified disorders of bone density and structure, unspecified site: Secondary | ICD-10-CM

## 2019-07-06 DIAGNOSIS — Z780 Asymptomatic menopausal state: Secondary | ICD-10-CM | POA: Diagnosis not present

## 2019-08-05 HISTORY — PX: DENTAL SURGERY: SHX609

## 2019-08-10 DIAGNOSIS — M79642 Pain in left hand: Secondary | ICD-10-CM | POA: Diagnosis not present

## 2019-08-16 ENCOUNTER — Other Ambulatory Visit: Payer: Self-pay

## 2019-08-16 ENCOUNTER — Ambulatory Visit
Admission: RE | Admit: 2019-08-16 | Discharge: 2019-08-16 | Disposition: A | Discharge status: Home / Self Care | Payer: BC Managed Care – PPO | Source: Ambulatory Visit | Attending: Internal Medicine | Admitting: Internal Medicine

## 2019-08-16 DIAGNOSIS — Z1231 Encounter for screening mammogram for malignant neoplasm of breast: Secondary | ICD-10-CM | POA: Diagnosis not present

## 2019-08-23 DIAGNOSIS — H3562 Retinal hemorrhage, left eye: Secondary | ICD-10-CM | POA: Diagnosis not present

## 2019-08-23 DIAGNOSIS — Q141 Congenital malformation of retina: Secondary | ICD-10-CM | POA: Diagnosis not present

## 2019-08-23 DIAGNOSIS — H04123 Dry eye syndrome of bilateral lacrimal glands: Secondary | ICD-10-CM | POA: Diagnosis not present

## 2019-08-23 DIAGNOSIS — H2513 Age-related nuclear cataract, bilateral: Secondary | ICD-10-CM | POA: Diagnosis not present

## 2019-08-31 ENCOUNTER — Ambulatory Visit: Payer: BC Managed Care – PPO

## 2019-09-09 ENCOUNTER — Ambulatory Visit: Payer: BC Managed Care – PPO | Attending: Internal Medicine

## 2019-09-09 DIAGNOSIS — Z23 Encounter for immunization: Secondary | ICD-10-CM | POA: Insufficient documentation

## 2019-09-09 NOTE — Progress Notes (Signed)
   Covid-19 Vaccination Clinic  Name:  [name]    MRN: [id number] DOB: [birth year]  09/09/2019  [name] was observed post Covid-19 immunization for 15 minutes without incidence. She was provided with Vaccine Information Sheet and instruction to access the V-Safe system.   [name] was instructed to call 911 with any severe reactions post vaccine: Marland Kitchen Difficulty breathing  . Swelling of your face and throat  . A fast heartbeat  . A bad rash all over your body  . Dizziness and weakness    Immunizations Administered    Name Date Dose VIS Date Route   Pfizer COVID-19 Vaccine 09/09/2019  9:11 AM 0.3 mL 07/15/2019 Intramuscular   Manufacturer: [place]   Lot: [id number]   [place]: [id number]

## 2019-09-13 DIAGNOSIS — L570 Actinic keratosis: Secondary | ICD-10-CM | POA: Diagnosis not present

## 2019-09-13 DIAGNOSIS — D485 Neoplasm of uncertain behavior of skin: Secondary | ICD-10-CM | POA: Diagnosis not present

## 2019-09-13 DIAGNOSIS — Z23 Encounter for immunization: Secondary | ICD-10-CM | POA: Diagnosis not present

## 2019-09-13 DIAGNOSIS — L920 Granuloma annulare: Secondary | ICD-10-CM | POA: Diagnosis not present

## 2019-09-13 DIAGNOSIS — L738 Other specified follicular disorders: Secondary | ICD-10-CM | POA: Diagnosis not present

## 2019-09-21 ENCOUNTER — Ambulatory Visit: Payer: BC Managed Care – PPO

## 2019-10-04 ENCOUNTER — Ambulatory Visit: Payer: BC Managed Care – PPO | Attending: Internal Medicine

## 2019-10-04 DIAGNOSIS — Z23 Encounter for immunization: Secondary | ICD-10-CM | POA: Insufficient documentation

## 2019-10-04 NOTE — Progress Notes (Signed)
   Covid-19 Vaccination Clinic  Name:  [name]    MRN: [id number] DOB: [birth year]  10/04/2019  [name] was observed post Covid-19 immunization for 30 minutes based on pre-vaccination screening without incident. She was provided with Vaccine Information Sheet and instruction to access the V-Safe system.   [name] was instructed to call 911 with any severe reactions post vaccine: Marland Kitchen Difficulty breathing  . Swelling of face and throat  . A fast heartbeat  . A bad rash all over body  . Dizziness and weakness   Immunizations Administered    Name Date Dose VIS Date Route   Pfizer COVID-19 Vaccine 10/04/2019 10:03 AM 0.3 mL 07/15/2019 Intramuscular   Manufacturer: [place]   Lot: [id number]   [place]: [id number]

## 2019-10-05 NOTE — Progress Notes (Signed)
69 y.o. G0P0000 Married White or Caucasian Not Hispanic or Latino female here for annual exam.   H/O TAH/BSO.     No LMP recorded. Patient has had a hysterectomy.          Sexually active: No.  The current method of family planning is status post hysterectomy.    Exercising: Yes.    walking daily  Smoker:  no  Health Maintenance: Pap:  Unsure  History of abnormal Pap:  no MMG:  08/16/19 Density C Bi-rads 1 Neg  BMD:   07/06/19 Osteopenic, spine -2.3, FRAX 12.9/1%  Colonoscopy 09-29-14 polyps repeat in 5 yrs  TDaP:  2011 Gardasil: NA  reports that she quit smoking about 45 years ago. She has never used smokeless tobacco. She reports current alcohol use of about 3.0 standard drinks of alcohol per week. She reports that she does not use drugs. 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 No children. Husband still works.  Past Medical History:  Diagnosis Date  . ALLERGIC RHINITIS 04/04/2007  . BURSITIS, RIGHT HIP 04/04/2007  . COLONIC POLYPS, HX OF 04/04/2007  . FATIGUE 05/12/2007  . HYPERLIPIDEMIA 04/04/2007  . LATERAL EPICONDYLITIS, RIGHT 03/13/2010  . 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 OSTEOARTHRITIS 04/04/2007  . OSTEOARTHRITIS, KNEE, RIGHT 02/12/2009  . OSTEOPENIA 03/13/2010  . Palpitations 05/12/2007  . Unspecified vitamin D deficiency 02/12/2009    Past Surgical History:  Procedure Laterality Date  . ABDOMINAL HYSTERECTOMY  2002  . BLEPHAROPLASTY Bilateral 2015  . COLONOSCOPY    . COSMETIC SURGERY  2014   eye lift  . hemorrhoidectomoy  1988  . kidney stone removal  2005  . OOPHORECTOMY  2002    Current Outpatient Medications  Medication Sig Dispense Refill  . atorvastatin (LIPITOR) 80 MG tablet Take 1 tablet (80 mg total) by mouth daily. 90 tablet 3  . Calcium Carb-Cholecalciferol (CALCIUM 500 + D3 PO) Take by mouth.    . Cholecalciferol (VITAMIN D) 2000 units tablet Take 1 tablet (2,000 Units total) by mouth daily. 30 tablet 11  . Glucosamine-Chondroitin (MOVE FREE PO) Take  by mouth.    . Magnesium Oxide 250 MG TABS Take 250 mg by mouth daily.    . vitamin C (ASCORBIC ACID) 500 MG tablet Take 500 mg by mouth daily.     No current facility-administered medications for this visit.    Family History  Problem Relation Age of Onset  . Gastric cancer Paternal Grandfather   . Heart disease Mother        died at 68yo  . Heart disease Father        died at 55yo  . Colon cancer Neg Hx     Review of Systems  All other systems reviewed and are negative.   Exam:   There were no vitals taken for this visit.  Weight change: @WEIGHTCHANGE @ Height:      Ht Readings from Last 3 Encounters:  12/30/18 5\' 3"  (1.6 m)  09/30/18 5' 3.39" (1.61 m)  10/13/17 5' 3.25" (1.607 m)    General appearance: alert, cooperative and appears stated age Head: Normocephalic, without obvious abnormality, atraumatic Neck: no adenopathy, supple, symmetrical, trachea midline and thyroid normal to inspection and palpation Lungs: clear to auscultation bilaterally Cardiovascular: regular rate and rhythm Breasts: normal appearance, no masses or tenderness Abdomen: soft, non-tender; non distended,  no masses,  no organomegaly Extremities: extremities normal, atraumatic, no cyanosis or edema Skin: Skin color, texture, turgor normal. No rashes or lesions Lymph nodes: Cervical, supraclavicular, and axillary  nodes normal. No abnormal inguinal nodes palpated Neurologic: Grossly normal   Pelvic: External genitalia:  no lesions              Urethra:  normal appearing urethra with no masses, tenderness or lesions              Bartholins and Skenes: normal                 Vagina: atrophic appearing vagina with normal color and discharge, no lesions              Cervix: absent               Bimanual Exam:  Uterus:  uterus absent              Adnexa: no mass, fullness, tenderness               Rectovaginal: Confirms               Anus:  normal sphincter tone, no lesions  Gae Dry  chaperoned for the exam.  A:  Well Woman with normal exam  Osteopenia  H/O TAH/BSO  P:   No pap   Labs with primary  Mammogram UTD  DEXA UTD  Colonoscopy due this year  Discussed breast self exam  Discussed calcium and vit D intake  TDAP with primary

## 2019-10-06 ENCOUNTER — Ambulatory Visit (INDEPENDENT_AMBULATORY_CARE_PROVIDER_SITE_OTHER): Payer: BC Managed Care – PPO | Admitting: Obstetrics and Gynecology

## 2019-10-06 ENCOUNTER — Encounter: Payer: Self-pay | Admitting: Obstetrics and Gynecology

## 2019-10-06 ENCOUNTER — Other Ambulatory Visit: Payer: Self-pay

## 2019-10-06 VITALS — BP 120/68 | HR 85 | Temp 97.9°F | Ht 63.25 in | Wt 189.0 lb

## 2019-10-06 DIAGNOSIS — Z01419 Encounter for gynecological examination (general) (routine) without abnormal findings: Secondary | ICD-10-CM

## 2019-10-06 DIAGNOSIS — M8580 Other specified disorders of bone density and structure, unspecified site: Secondary | ICD-10-CM

## 2019-10-06 NOTE — Patient Instructions (Signed)
EXERCISE AND DIET:  We recommended that you start or continue a regular exercise program for good health. Regular exercise means any activity that makes your heart beat faster and makes you sweat.  We recommend exercising at least 30 minutes per day at least 3 days a week, preferably 4 or 5.  We also recommend a diet low in fat and sugar.  Inactivity, poor dietary choices and obesity can cause diabetes, heart attack, stroke, and kidney damage, among others.    ALCOHOL AND SMOKING:  Women should limit their alcohol intake to no more than 7 drinks/beers/glasses of wine (combined, not each!) per week. Moderation of alcohol intake to this level decreases your risk of breast cancer and liver damage. And of course, no recreational drugs are part of a healthy lifestyle.  And absolutely no smoking or even second hand smoke. Most people know smoking can cause heart and lung diseases, but did you know it also contributes to weakening of your bones? Aging of your skin?  Yellowing of your teeth and nails?  CALCIUM AND VITAMIN D:  Adequate intake of calcium and Vitamin D are recommended.  The recommendations for exact amounts of these supplements seem to change often, but generally speaking 1,200 mg of calcium (between diet and supplement) and 800 units of Vitamin D per day seems prudent. Certain women may benefit from higher intake of Vitamin D.  If you are among these women, your doctor will have told you during your visit.    PAP SMEARS:  Pap smears, to check for cervical cancer or precancers,  have traditionally been done yearly, although recent scientific advances have shown that most women can have pap smears less often.  However, every woman still should have a physical exam from her gynecologist every year. It will include a breast check, inspection of the vulva and vagina to check for abnormal growths or skin changes, a visual exam of the cervix, and then an exam to evaluate the size and shape of the uterus and  ovaries.  And after 67 years of age, a rectal exam is indicated to check for rectal cancers. We will also provide age appropriate advice regarding health maintenance, like when you should have certain vaccines, screening for sexually transmitted diseases, bone density testing, colonoscopy, mammograms, etc.   MAMMOGRAMS:  All women over 40 years old should have a yearly mammogram. Many facilities now offer a "3D" mammogram, which may cost around $50 extra out of pocket. If possible,  we recommend you accept the option to have the 3D mammogram performed.  It both reduces the number of women who will be called back for extra views which then turn out to be normal, and it is better than the routine mammogram at detecting truly abnormal areas.    COLON CANCER SCREENING: Now recommend starting at age 45. At this time colonoscopy is not covered for routine screening until 50. There are take home tests that can be done between 45-49.   COLONOSCOPY:  Colonoscopy to screen for colon cancer is recommended for all women at age 50.  We know, you hate the idea of the prep.  We agree, BUT, having colon cancer and not knowing it is worse!!  Colon cancer so often starts as a polyp that can be seen and removed at colonscopy, which can quite literally save your life!  And if your first colonoscopy is normal and you have no family history of colon cancer, most women don't have to have it again for   10 years.  Once every ten years, you can do something that may end up saving your life, right?  We will be happy to help you get it scheduled when you are ready.  Be sure to check your insurance coverage so you understand how much it will cost.  It may be covered as a preventative service at no cost, but you should check your particular policy.      Breast Self-Awareness Breast self-awareness means being familiar with how your breasts look and feel. It involves checking your breasts regularly and reporting any changes to your  health care provider. Practicing breast self-awareness is important. A change in your breasts can be a sign of a serious medical problem. Being familiar with how your breasts look and feel allows you to find any problems early, when treatment is more likely to be successful. All women should practice breast self-awareness, including women who have had breast implants. How to do a breast self-exam One way to learn what is normal for your breasts and whether your breasts are changing is to do a breast self-exam. To do a breast self-exam: Look for Changes  1. Remove all the clothing above your waist. 2. Stand in front of a mirror in a room with good lighting. 3. Put your hands on your hips. 4. Push your hands firmly downward. 5. Compare your breasts in the mirror. Look for differences between them (asymmetry), such as: ? Differences in shape. ? Differences in size. ? Puckers, dips, and bumps in one breast and not the other. 6. Look at each breast for changes in your skin, such as: ? Redness. ? Scaly areas. 7. Look for changes in your nipples, such as: ? Discharge. ? Bleeding. ? Dimpling. ? Redness. ? A change in position. Feel for Changes Carefully feel your breasts for lumps and changes. It is best to do this while lying on your back on the floor and again while sitting or standing in the shower or tub with soapy water on your skin. Feel each breast in the following way:  Place the arm on the side of the breast you are examining above your head.  Feel your breast with the other hand.  Start in the nipple area and make  inch (2 cm) overlapping circles to feel your breast. Use the pads of your three middle fingers to do this. Apply light pressure, then medium pressure, then firm pressure. The light pressure will allow you to feel the tissue closest to the skin. The medium pressure will allow you to feel the tissue that is a little deeper. The firm pressure will allow you to feel the tissue  close to the ribs.  Continue the overlapping circles, moving downward over the breast until you feel your ribs below your breast.  Move one finger-width toward the center of the body. Continue to use the  inch (2 cm) overlapping circles to feel your breast as you move slowly up toward your collarbone.  Continue the up and down exam using all three pressures until you reach your armpit.  Write Down What You Find  Write down what is normal for each breast and any changes that you find. Keep a written record with breast changes or normal findings for each breast. By writing this information down, you do not need to depend only on memory for size, tenderness, or location. Write down where you are in your menstrual cycle, if you are still menstruating. If you are having trouble noticing differences   in your breasts, do not get discouraged. With time you will become more familiar with the variations in your breasts and more comfortable with the exam. How often should I examine my breasts? Examine your breasts every month. If you are breastfeeding, the best time to examine your breasts is after a feeding or after using a breast pump. If you menstruate, the best time to examine your breasts is 5-7 days after your period is over. During your period, your breasts are lumpier, and it may be more difficult to notice changes. When should I see my health care provider? See your health care provider if you notice:  A change in shape or size of your breasts or nipples.  A change in the skin of your breast or nipples, such as a reddened or scaly area.  Unusual discharge from your nipples.  A lump or thick area that was not there before.  Pain in your breasts.  Anything that concerns you.  

## 2019-10-14 ENCOUNTER — Ambulatory Visit (AMBULATORY_SURGERY_CENTER): Payer: Self-pay | Admitting: *Deleted

## 2019-10-14 ENCOUNTER — Other Ambulatory Visit: Payer: Self-pay

## 2019-10-14 VITALS — Temp 97.5°F | Ht 63.0 in | Wt 190.0 lb

## 2019-10-14 DIAGNOSIS — Z86010 Personal history of colonic polyps: Secondary | ICD-10-CM

## 2019-10-14 NOTE — Progress Notes (Signed)
Patient denies any allergies to egg or soy products. Patient denies complications with anesthesia/sedation.  Patient denies oxygen use at home and denies diet medications. Emmi instructions for colonoscopy/endoscopy explained but patient denied information.No covid test needed.  Patient had both covid vaccinations, last one on 10/04/19.

## 2019-10-19 DIAGNOSIS — H04123 Dry eye syndrome of bilateral lacrimal glands: Secondary | ICD-10-CM | POA: Diagnosis not present

## 2019-10-19 DIAGNOSIS — H3562 Retinal hemorrhage, left eye: Secondary | ICD-10-CM | POA: Diagnosis not present

## 2019-10-19 DIAGNOSIS — Q141 Congenital malformation of retina: Secondary | ICD-10-CM | POA: Diagnosis not present

## 2019-10-19 DIAGNOSIS — H2513 Age-related nuclear cataract, bilateral: Secondary | ICD-10-CM | POA: Diagnosis not present

## 2019-10-26 ENCOUNTER — Encounter: Payer: Self-pay | Admitting: Internal Medicine

## 2019-10-28 ENCOUNTER — Encounter: Payer: Self-pay | Admitting: Internal Medicine

## 2019-10-28 ENCOUNTER — Other Ambulatory Visit: Payer: Self-pay

## 2019-10-28 ENCOUNTER — Ambulatory Visit (AMBULATORY_SURGERY_CENTER): Payer: BC Managed Care – PPO | Admitting: Internal Medicine

## 2019-10-28 VITALS — BP 140/66 | HR 66 | Temp 96.6°F | Resp 18 | Ht 63.5 in | Wt 190.0 lb

## 2019-10-28 DIAGNOSIS — Z1211 Encounter for screening for malignant neoplasm of colon: Secondary | ICD-10-CM | POA: Diagnosis not present

## 2019-10-28 DIAGNOSIS — Z86010 Personal history of colonic polyps: Secondary | ICD-10-CM

## 2019-10-28 DIAGNOSIS — K635 Polyp of colon: Secondary | ICD-10-CM | POA: Diagnosis not present

## 2019-10-28 DIAGNOSIS — D122 Benign neoplasm of ascending colon: Secondary | ICD-10-CM

## 2019-10-28 DIAGNOSIS — D124 Benign neoplasm of descending colon: Secondary | ICD-10-CM

## 2019-10-28 MED ORDER — SODIUM CHLORIDE 0.9 % IV SOLN: 500.0000 mL | Freq: Once | INTRAVENOUS | Status: DC

## 2019-10-28 NOTE — Progress Notes (Signed)
Called to room to assist during endoscopic procedure.  Patient ID and intended procedure confirmed with present staff. Received instructions for my participation in the procedure from the performing physician.  

## 2019-10-28 NOTE — Progress Notes (Signed)
To PACU, VSS. Report to [email or web address] 

## 2019-10-28 NOTE — Progress Notes (Signed)
Temp-Lisa Clapps VS- Nash Mantis  Pt's states no medical or surgical changes since previsit or office visit.

## 2019-10-28 NOTE — Op Note (Signed)
[facility] Patient Name: [name] Procedure Date: 10/28/2019 2:32 PM MRN: [id number] Endoscopist: Gatha Mayer , MD Age: 69 Referring MD:  Date of Birth: 23-Jan-1951 Gender: Female Account #: [id number] Procedure:                Colonoscopy Indications:              High risk colon cancer surveillance: Personal                            history of sessile serrated colon polyp (less than                            10 mm in size) with no dysplasia, Last colonoscopy:                            2016 Medicines:                Propofol per Anesthesia, Monitored Anesthesia Care Procedure:                Pre-Anesthesia Assessment:                           - Prior to the procedure, a History and Physical                            was performed, and patient medications and                            allergies were reviewed. The patient's tolerance of                            previous anesthesia was also reviewed. The risks                            and benefits of the procedure and the sedation                            options and risks were discussed with the patient.                            All questions were answered, and informed consent                            was obtained. Prior Anticoagulants: The patient has                            taken no previous anticoagulant or antiplatelet                            agents. ASA Grade Assessment: II - A patient with                            mild systemic disease. After reviewing the risks  and benefits, the patient was deemed in                            satisfactory condition to undergo the procedure.                           After obtaining informed consent, the colonoscope                            was passed under direct vision. Throughout the                            procedure, the patient's blood pressure, pulse, and                            oxygen saturations were  monitored continuously. The                            Colonoscope was introduced through the anus and                            advanced to the the cecum, identified by                            appendiceal orifice and ileocecal valve. The                            colonoscopy was performed without difficulty. The                            patient tolerated the procedure well. The quality                            of the bowel preparation was good. The bowel                            preparation used was Miralax via split dose                            instruction. The ileocecal valve, appendiceal                            orifice, and rectum were photographed. Scope In: 2:40:56 PM Scope Out: 3:01:33 PM Total Procedure Duration: 0 hours 20 minutes 37 seconds  Findings:                 The perianal and digital rectal examinations were                            normal.                           Two sessile polyps were found in the descending  colon and ascending colon. The polyps were 3 to 9                            mm in size. These polyps were removed with a cold                            snare. Resection and retrieval were complete.                            Verification of patient identification for the                            specimen was done. Estimated blood loss was minimal.                           The exam was otherwise without abnormality on                            direct and retroflexion views. Complications:            No immediate complications. Estimated Blood Loss:     Estimated blood loss was minimal. Impression:               - Two 3 to 9 mm polyps in the descending colon and                            in the ascending colon, removed with a cold snare.                            Resected and retrieved.                           - The examination was otherwise normal on direct                            and retroflexion  views.                           - Personal history of colonic polyps ssp 2016,                            rectal polyp 2007. Recommendation:           - Patient has a contact number available for                            emergencies. The signs and symptoms of potential                            delayed complications were discussed with the                            patient. Return to normal activities tomorrow.  Written discharge instructions were provided to the                            patient.                           - Resume previous diet.                           - Continue present medications.                           - Repeat colonoscopy is recommended for                            surveillance. The colonoscopy date will be                            determined after pathology results from today's                            exam become available for review. Gatha Mayer, MD 10/28/2019 3:09:17 PM This report has been signed electronically.

## 2019-10-28 NOTE — Patient Instructions (Addendum)
I found and removed 2 polyps today. I will let you know pathology results and when to have another routine colonoscopy by mail and/or My Chart.  I appreciate the opportunity to care for you. Gatha Mayer, MD, [facility]  Read all of the handouts given to you by your recovery room nurse.  Thank-you for choosing [place] for your healthcare needs today.  YOU HAD AN ENDOSCOPIC PROCEDURE TODAY AT [facility]:   Refer to the procedure report that was given to you for any specific questions about what was found during the examination.  If the procedure report does not answer your questions, please call your gastroenterologist to clarify.  If you requested that your care partner not be given the details of your procedure findings, then the procedure report has been included in a sealed envelope for you to review at your convenience later.  YOU SHOULD EXPECT: Some feelings of bloating in the abdomen. Passage of more gas than usual.  Walking can help get rid of the air that was put into your GI tract during the procedure and reduce the bloating. If you had a lower endoscopy (such as a colonoscopy or flexible sigmoidoscopy) you may notice spotting of blood in your stool or on the toilet paper. If you underwent a bowel prep for your procedure, you may not have a normal bowel movement for a few days.  Please Note:  You might notice some irritation and congestion in your nose or some drainage.  This is from the oxygen used during your procedure.  There is no need for concern and it should clear up in a day or so.  SYMPTOMS TO REPORT IMMEDIATELY:   Following lower endoscopy (colonoscopy or flexible sigmoidoscopy):  Excessive amounts of blood in the stool  Significant tenderness or worsening of abdominal pains  Swelling of the abdomen that is new, acute  Fever of 100F or higher   For urgent or emergent issues, a gastroenterologist can be reached at any hour by calling [phone]. Do not use  MyChart messaging for urgent concerns.    DIET:  We do recommend a small meal at first, but then you may proceed to your regular diet.  Drink plenty of fluids but you should avoid alcoholic beverages for 24 hours.  ACTIVITY:  You should plan to take it easy for the rest of today and you should NOT DRIVE or use heavy machinery until tomorrow (because of the sedation medicines used during the test).    FOLLOW UP: Our staff will call the number listed on your records 48-72 hours following your procedure to check on you and address any questions or concerns that you may have regarding the information given to you following your procedure. If we do not reach you, we will leave a message.  We will attempt to reach you two times.  During this call, we will ask if you have developed any symptoms of COVID 19. If you develop any symptoms (ie: fever, flu-like symptoms, shortness of breath, cough etc.) before then, please call [phone].  If you test positive for Covid 19 in the 2 weeks post procedure, please call and report this information to [place].    If any biopsies were taken you will be contacted by phone or by letter within the next 1-3 weeks.  Please call us at [phone] if you have not heard about the biopsies in 3 weeks.    SIGNATURES/CONFIDENTIALITY: You and/or your care partner have signed paperwork which  will be entered into your electronic medical record.  These signatures attest to the fact that that the information above on your After Visit Summary has been reviewed and is understood.  Full responsibility of the confidentiality of this discharge information lies with you and/or your care-partner.

## 2019-11-01 ENCOUNTER — Telehealth: Payer: Self-pay

## 2019-11-01 ENCOUNTER — Telehealth: Payer: Self-pay | Admitting: *Deleted

## 2019-11-01 NOTE — Telephone Encounter (Signed)
  Follow up Call-  Call back number 10/28/2019  Post procedure Call Back phone  # [phone]  Permission to leave phone message Yes  Some recent data might be hidden     Patient questions:  Do you have a fever, pain , or abdominal swelling? No. Pain Score  0 *  Have you tolerated food without any problems? Yes.    Have you been able to return to your normal activities? Yes.    Do you have any questions about your discharge instructions: Diet   No. Medications  No. Follow up visit  No.  Do you have questions or concerns about your Care? No.  Actions: * If pain score is 4 or above: No action needed, pain <4.  1. Have you developed a fever since your procedure? no  2.   Have you had an respiratory symptoms (SOB or cough) since your procedure? no  3.   Have you tested positive for COVID 19 since your procedure no  4.   Have you had any family members/close contacts diagnosed with the COVID 19 since your procedure?  no   If yes to any of these questions please route to Joylene John, RN and Erenest Rasher, RN

## 2019-11-01 NOTE — Telephone Encounter (Signed)
Left message on follow up call. 

## 2019-11-03 ENCOUNTER — Encounter: Payer: Self-pay | Admitting: Internal Medicine

## 2020-01-03 ENCOUNTER — Encounter: Payer: BLUE CROSS/BLUE SHIELD | Admitting: Internal Medicine

## 2020-01-04 ENCOUNTER — Other Ambulatory Visit (INDEPENDENT_AMBULATORY_CARE_PROVIDER_SITE_OTHER): Payer: BC Managed Care – PPO

## 2020-01-04 ENCOUNTER — Other Ambulatory Visit: Payer: Self-pay

## 2020-01-04 DIAGNOSIS — R739 Hyperglycemia, unspecified: Secondary | ICD-10-CM

## 2020-01-04 DIAGNOSIS — Z0000 Encounter for general adult medical examination without abnormal findings: Secondary | ICD-10-CM | POA: Diagnosis not present

## 2020-01-04 LAB — URINALYSIS, ROUTINE W REFLEX MICROSCOPIC
Bilirubin Urine: NEGATIVE
Ketones, ur: NEGATIVE
Leukocytes,Ua: NEGATIVE
Nitrite: NEGATIVE
Specific Gravity, Urine: >=1.03 — AB (ref 1.000–1.030)
Total Protein, Urine: NEGATIVE
Urine Glucose: NEGATIVE
Urobilinogen, UA: 0.2 (ref 0.0–1.0)
pH: 6 (ref 5.0–8.0)

## 2020-01-04 LAB — CBC WITH DIFFERENTIAL/PLATELET
Basophils Absolute: 0 10*3/uL (ref 0.0–0.1)
Basophils Relative: 0.6 % (ref 0.0–3.0)
Eosinophils Absolute: 0.1 10*3/uL (ref 0.0–0.7)
Eosinophils Relative: 1.6 % (ref 0.0–5.0)
HCT: 41.2 % (ref 36.0–46.0)
Hemoglobin: 14 g/dL (ref 12.0–15.0)
Lymphocytes Relative: 19.6 % (ref 12.0–46.0)
Lymphs Abs: 1.2 10*3/uL (ref 0.7–4.0)
MCHC: 33.9 g/dL (ref 30.0–36.0)
MCV: 86.7 fl (ref 78.0–100.0)
Monocytes Absolute: 0.7 10*3/uL (ref 0.1–1.0)
Monocytes Relative: 11 % (ref 3.0–12.0)
Neutro Abs: 4 10*3/uL (ref 1.4–7.7)
Neutrophils Relative %: 67.2 % (ref 43.0–77.0)
Platelets: 227 10*3/uL (ref 150.0–400.0)
RBC: 4.75 Mil/uL (ref 3.87–5.11)
RDW: 13.3 % (ref 11.5–15.5)
WBC: 5.9 10*3/uL (ref 4.0–10.5)

## 2020-01-04 LAB — BASIC METABOLIC PANEL
BUN: 16 mg/dL (ref 6–23)
CO2: 31 mEq/L (ref 19–32)
Calcium: 9.9 mg/dL (ref 8.4–10.5)
Chloride: 105 mEq/L (ref 96–112)
Creatinine, Ser: 0.67 mg/dL (ref 0.40–1.20)
GFR: 87.89 mL/min (ref >60.00)
Glucose, Bld: 107 mg/dL — ABNORMAL HIGH (ref 70–99)
Potassium: 4.2 mEq/L (ref 3.5–5.1)
Sodium: 139 mEq/L (ref 135–145)

## 2020-01-04 LAB — HEPATIC FUNCTION PANEL
ALT: 29 U/L (ref 0–35)
AST: 26 U/L (ref 0–37)
Albumin: 4.3 g/dL (ref 3.5–5.2)
Alkaline Phosphatase: 107 U/L (ref 39–117)
Bilirubin, Direct: 0.2 mg/dL (ref 0.0–0.3)
Total Bilirubin: 0.7 mg/dL (ref 0.2–1.2)
Total Protein: 7.2 g/dL (ref 6.0–8.3)

## 2020-01-04 LAB — LIPID PANEL
Cholesterol: 226 mg/dL — ABNORMAL HIGH (ref 0–200)
HDL: 58.4 mg/dL (ref >39.00)
LDL Cholesterol: 138 mg/dL — ABNORMAL HIGH (ref 0–99)
NonHDL: 168.08
Total CHOL/HDL Ratio: 4
Triglycerides: 151 mg/dL — ABNORMAL HIGH (ref 0.0–149.0)
VLDL: 30.2 mg/dL (ref 0.0–40.0)

## 2020-01-04 LAB — HEMOGLOBIN A1C: Hgb A1c MFr Bld: 5.7 % (ref 4.6–6.5)

## 2020-01-04 LAB — TSH: TSH: 2.49 u[IU]/mL (ref 0.35–4.50)

## 2020-01-10 ENCOUNTER — Other Ambulatory Visit: Payer: Self-pay

## 2020-01-10 ENCOUNTER — Ambulatory Visit (INDEPENDENT_AMBULATORY_CARE_PROVIDER_SITE_OTHER): Payer: BC Managed Care – PPO | Admitting: Internal Medicine

## 2020-01-10 ENCOUNTER — Encounter: Payer: Self-pay | Admitting: Internal Medicine

## 2020-01-10 VITALS — BP 122/68 | HR 93 | Temp 98.8°F | Ht 63.5 in | Wt 194.0 lb

## 2020-01-10 DIAGNOSIS — Z0000 Encounter for general adult medical examination without abnormal findings: Secondary | ICD-10-CM | POA: Diagnosis not present

## 2020-01-10 DIAGNOSIS — E785 Hyperlipidemia, unspecified: Secondary | ICD-10-CM | POA: Diagnosis not present

## 2020-01-10 DIAGNOSIS — Z23 Encounter for immunization: Secondary | ICD-10-CM | POA: Diagnosis not present

## 2020-01-10 DIAGNOSIS — E538 Deficiency of other specified B group vitamins: Secondary | ICD-10-CM

## 2020-01-10 DIAGNOSIS — R739 Hyperglycemia, unspecified: Secondary | ICD-10-CM | POA: Diagnosis not present

## 2020-01-10 DIAGNOSIS — E559 Vitamin D deficiency, unspecified: Secondary | ICD-10-CM

## 2020-01-10 NOTE — Assessment & Plan Note (Signed)
stable overall by history and exam, recent data reviewed with pt, and pt to continue medical treatment as before,  to f/u any worsening symptoms or concerns  

## 2020-01-10 NOTE — Assessment & Plan Note (Signed)

## 2020-01-10 NOTE — Progress Notes (Signed)
Subjective:    Patient ID: [name], female    DOB: 1951-03-19, 69 y.o.   MRN: [id number]  HPI  Here for wellness and f/u;  Overall doing ok;  Pt denies Chest pain, worsening SOB, DOE, wheezing, orthopnea, PND, worsening LE edema, palpitations, dizziness or syncope.  Pt denies neurological change such as new headache, facial or extremity weakness.  Pt denies polydipsia, polyuria, or low sugar symptoms. Pt states overall good compliance with treatment and medications, good tolerability, and has been trying to follow appropriate diet.  Pt denies worsening depressive symptoms, suicidal ideation or panic. No fever, night sweats, wt loss, loss of appetite, or other constitutional symptoms.  Pt states good ability with ADL's, has low fall risk, home safety reviewed and adequate, no other significant changes in hearing or vision, and only occasionally active with exercise. No new complaints Past Medical History:  Diagnosis Date  . ALLERGIC RHINITIS 04/04/2007  . Allergy   . BURSITIS, RIGHT HIP 04/04/2007  . COLONIC POLYPS, HX OF 04/04/2007  . FATIGUE 05/12/2007  . HYPERLIPIDEMIA 04/04/2007  . LATERAL EPICONDYLITIS, RIGHT 03/13/2010  . 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 OSTEOARTHRITIS 04/04/2007  . OSTEOARTHRITIS, KNEE, RIGHT 02/12/2009  . OSTEOPENIA 03/13/2010  . Palpitations 05/12/2007   stress/anxiety related  . Unspecified vitamin D deficiency 02/12/2009   Past Surgical History:  Procedure Laterality Date  . ABDOMINAL HYSTERECTOMY  2002  . BLEPHAROPLASTY Bilateral 2015  . COLONOSCOPY  09/29/2014   Carlean Purl- ssp polyp  . COSMETIC SURGERY  2014   eye lift  . DENTAL SURGERY  08/2019   perm implant  . hemorrhoidectomoy  1988  . kidney stone removal  2005  . OOPHORECTOMY  2002    reports that she quit smoking about 45 years ago. Her smoking use included cigarettes. She has never used smokeless tobacco. She reports current alcohol use of about 3.0 - 4.0 standard drinks of alcohol per week. She reports  that she does not use drugs. family history includes Gastric cancer in her paternal grandfather; Heart disease in her father and mother; Stomach cancer in her maternal grandfather. Allergies  Allergen Reactions  . Sulfonamide Derivatives Swelling   Current Outpatient Medications on File Prior to Visit  Medication Sig Dispense Refill  . atorvastatin (LIPITOR) 80 MG tablet Take 1 tablet (80 mg total) by mouth daily. 90 tablet 3  . Calcium Carb-Cholecalciferol (CALCIUM 500 + D3 PO) Take by mouth.    . Cholecalciferol (VITAMIN D) 2000 units tablet Take 1 tablet (2,000 Units total) by mouth daily. 30 tablet 11  . Glucosamine-Chondroitin (MOVE FREE PO) Take by mouth.    . Magnesium Oxide 250 MG TABS Take 250 mg by mouth daily.    . metroNIDAZOLE (METROCREAM) 0.75 % cream APPLY TO AFFECTED AREA TWICE A DAY    . vitamin C (ASCORBIC ACID) 500 MG tablet Take 500 mg by mouth daily.    Marland Kitchen VITAMIN K PO Take 20 mcg by mouth daily.     No current facility-administered medications on file prior to visit.   Review of Systems All otherwise neg per pt     Objective:   Physical Exam BP 122/68 (BP Location: Left Arm, Patient Position: Sitting, Cuff Size: Large)   Pulse 93   Temp 98.8 F (37.1 C) (Oral)   Ht 5' 3.5" (1.613 m)   Wt 194 lb (88 kg)   LMP  (LMP Unknown)   SpO2 96%   BMI 33.83 kg/m  VS noted,  Constitutional: Pt appears  in NAD HENT: Head: NCAT.  Right Ear: External ear normal.  Left Ear: External ear normal.  Eyes: . Pupils are equal, round, and reactive to light. Conjunctivae and EOM are normal Nose: without d/c or deformity Neck: Neck supple. Gross normal ROM Cardiovascular: Normal rate and regular rhythm.   Pulmonary/Chest: Effort normal and breath sounds without rales or wheezing.  Abd:  Soft, NT, ND, + BS, no organomegaly Neurological: Pt is alert. At baseline orientation, motor grossly intact Skin: Skin is warm. No rashes, other new lesions, no LE edema Psychiatric: Pt  behavior is normal without agitation  All otherwise neg per pt Lab Results  Component Value Date   WBC 5.9 01/04/2020   HGB 14.0 01/04/2020   HCT 41.2 01/04/2020   PLT 227.0 01/04/2020   GLUCOSE 107 (H) 01/04/2020   CHOL 226 (H) 01/04/2020   TRIG 151.0 (H) 01/04/2020   HDL 58.40 01/04/2020   LDLDIRECT 162.0 03/06/2010   LDLCALC 138 (H) 01/04/2020   ALT 29 01/04/2020   AST 26 01/04/2020   NA 139 01/04/2020   K 4.2 01/04/2020   CL 105 01/04/2020   CREATININE 0.67 01/04/2020   BUN 16 01/04/2020   CO2 31 01/04/2020   TSH 2.49 01/04/2020   INR 1.0 01/18/2019   HGBA1C 5.7 01/04/2020      Assessment & Plan:

## 2020-01-10 NOTE — Assessment & Plan Note (Signed)
For ct cardiac score

## 2020-01-10 NOTE — Patient Instructions (Signed)
You had the Pneumovax pneumonia shot today  We have discussed the Cardiac CT Score test to measure the calcification level (if any) in your heart arteries.  This test has been ordered in our [place], so please call [facility] directly, as they prefer this, at [phone] to be scheduled.  Please continue all other medications as before, and refills have been done if requested.  Please have the pharmacy call with any other refills you may need.  Please continue your efforts at being more active, low cholesterol diet, and weight control.  You are otherwise up to date with prevention measures today.  Please keep your appointments with your specialists as you may have planned  Please make an Appointment to return for your 1 year visit, or sooner if needed, with Lab testing by Appointment as well, to be done about 3-5 days before at the [place] (so this is for TWO appointments - please see the scheduling desk as you leave)

## 2020-01-10 NOTE — Assessment & Plan Note (Signed)
For oral replacement 

## 2020-01-14 ENCOUNTER — Encounter: Payer: Self-pay | Admitting: Internal Medicine

## 2020-01-14 DIAGNOSIS — E785 Hyperlipidemia, unspecified: Secondary | ICD-10-CM

## 2020-01-16 MED ORDER — ATORVASTATIN CALCIUM 80 MG PO TABS: 80.0000 mg | ORAL_TABLET | Freq: Every day | ORAL | 3 refills | Status: DC

## 2020-01-25 ENCOUNTER — Inpatient Hospital Stay: Admission: RE | Admit: 2020-01-25 | Payer: BC Managed Care – PPO | Source: Ambulatory Visit

## 2020-02-23 ENCOUNTER — Other Ambulatory Visit: Payer: Self-pay

## 2020-02-23 ENCOUNTER — Ambulatory Visit (INDEPENDENT_AMBULATORY_CARE_PROVIDER_SITE_OTHER)
Admission: RE | Admit: 2020-02-23 | Discharge: 2020-02-23 | Disposition: A | Discharge status: Home / Self Care | Payer: Self-pay | Source: Ambulatory Visit | Attending: Internal Medicine | Admitting: Internal Medicine

## 2020-02-23 ENCOUNTER — Encounter: Payer: Self-pay | Admitting: Internal Medicine

## 2020-02-23 DIAGNOSIS — R739 Hyperglycemia, unspecified: Secondary | ICD-10-CM

## 2020-02-23 DIAGNOSIS — E785 Hyperlipidemia, unspecified: Secondary | ICD-10-CM

## 2020-02-27 DIAGNOSIS — H02132 Senile ectropion of right lower eyelid: Secondary | ICD-10-CM | POA: Diagnosis not present

## 2020-02-27 DIAGNOSIS — H02135 Senile ectropion of left lower eyelid: Secondary | ICD-10-CM | POA: Diagnosis not present

## 2020-04-19 ENCOUNTER — Ambulatory Visit (INDEPENDENT_AMBULATORY_CARE_PROVIDER_SITE_OTHER): Payer: BC Managed Care – PPO

## 2020-04-19 ENCOUNTER — Other Ambulatory Visit: Payer: Self-pay

## 2020-04-19 DIAGNOSIS — Z23 Encounter for immunization: Secondary | ICD-10-CM | POA: Diagnosis not present

## 2020-06-06 DIAGNOSIS — L738 Other specified follicular disorders: Secondary | ICD-10-CM | POA: Diagnosis not present

## 2020-06-06 DIAGNOSIS — L570 Actinic keratosis: Secondary | ICD-10-CM | POA: Diagnosis not present

## 2020-06-06 DIAGNOSIS — D485 Neoplasm of uncertain behavior of skin: Secondary | ICD-10-CM | POA: Diagnosis not present

## 2020-06-06 DIAGNOSIS — C44329 Squamous cell carcinoma of skin of other parts of face: Secondary | ICD-10-CM | POA: Diagnosis not present

## 2020-06-06 DIAGNOSIS — L821 Other seborrheic keratosis: Secondary | ICD-10-CM | POA: Diagnosis not present

## 2020-06-28 DIAGNOSIS — M25512 Pain in left shoulder: Secondary | ICD-10-CM | POA: Diagnosis not present

## 2020-06-28 DIAGNOSIS — S42256A Nondisplaced fracture of greater tuberosity of unspecified humerus, initial encounter for closed fracture: Secondary | ICD-10-CM | POA: Diagnosis not present

## 2020-06-28 DIAGNOSIS — S0083XA Contusion of other part of head, initial encounter: Secondary | ICD-10-CM | POA: Diagnosis not present

## 2020-06-28 DIAGNOSIS — S42255A Nondisplaced fracture of greater tuberosity of left humerus, initial encounter for closed fracture: Secondary | ICD-10-CM | POA: Diagnosis not present

## 2020-06-28 DIAGNOSIS — M25532 Pain in left wrist: Secondary | ICD-10-CM | POA: Diagnosis not present

## 2020-06-28 DIAGNOSIS — W01198A Fall on same level from slipping, tripping and stumbling with subsequent striking against other object, initial encounter: Secondary | ICD-10-CM | POA: Diagnosis not present

## 2020-06-28 DIAGNOSIS — M25522 Pain in left elbow: Secondary | ICD-10-CM | POA: Diagnosis not present

## 2020-07-02 ENCOUNTER — Ambulatory Visit: Payer: BC Managed Care – PPO | Admitting: Internal Medicine

## 2020-07-02 DIAGNOSIS — M25512 Pain in left shoulder: Secondary | ICD-10-CM | POA: Diagnosis not present

## 2020-07-02 DIAGNOSIS — S42256A Nondisplaced fracture of greater tuberosity of unspecified humerus, initial encounter for closed fracture: Secondary | ICD-10-CM | POA: Diagnosis not present

## 2020-07-03 ENCOUNTER — Other Ambulatory Visit: Payer: Self-pay | Admitting: Internal Medicine

## 2020-07-03 DIAGNOSIS — Z1231 Encounter for screening mammogram for malignant neoplasm of breast: Secondary | ICD-10-CM

## 2020-07-04 DIAGNOSIS — H2513 Age-related nuclear cataract, bilateral: Secondary | ICD-10-CM | POA: Diagnosis not present

## 2020-07-04 DIAGNOSIS — H04123 Dry eye syndrome of bilateral lacrimal glands: Secondary | ICD-10-CM | POA: Diagnosis not present

## 2020-07-04 DIAGNOSIS — Q141 Congenital malformation of retina: Secondary | ICD-10-CM | POA: Diagnosis not present

## 2020-07-04 DIAGNOSIS — S0993XA Unspecified injury of face, initial encounter: Secondary | ICD-10-CM | POA: Diagnosis not present

## 2020-07-05 ENCOUNTER — Encounter: Payer: Self-pay | Admitting: Obstetrics and Gynecology

## 2020-07-16 DIAGNOSIS — Z9889 Other specified postprocedural states: Secondary | ICD-10-CM | POA: Diagnosis not present

## 2020-07-18 DIAGNOSIS — C44329 Squamous cell carcinoma of skin of other parts of face: Secondary | ICD-10-CM | POA: Diagnosis not present

## 2020-08-04 DIAGNOSIS — S4292XA Fracture of left shoulder girdle, part unspecified, initial encounter for closed fracture: Secondary | ICD-10-CM

## 2020-08-04 HISTORY — DX: Fracture of left shoulder girdle, part unspecified, initial encounter for closed fracture: S42.92XA

## 2020-08-10 DIAGNOSIS — Z9889 Other specified postprocedural states: Secondary | ICD-10-CM | POA: Diagnosis not present

## 2020-08-16 ENCOUNTER — Ambulatory Visit: Payer: BC Managed Care – PPO

## 2020-08-16 DIAGNOSIS — S42256D Nondisplaced fracture of greater tuberosity of unspecified humerus, subsequent encounter for fracture with routine healing: Secondary | ICD-10-CM | POA: Diagnosis not present

## 2020-08-16 DIAGNOSIS — R531 Weakness: Secondary | ICD-10-CM | POA: Diagnosis not present

## 2020-08-16 DIAGNOSIS — M25612 Stiffness of left shoulder, not elsewhere classified: Secondary | ICD-10-CM | POA: Diagnosis not present

## 2020-08-21 DIAGNOSIS — S42256D Nondisplaced fracture of greater tuberosity of unspecified humerus, subsequent encounter for fracture with routine healing: Secondary | ICD-10-CM | POA: Diagnosis not present

## 2020-08-21 DIAGNOSIS — R531 Weakness: Secondary | ICD-10-CM | POA: Diagnosis not present

## 2020-08-21 DIAGNOSIS — M25612 Stiffness of left shoulder, not elsewhere classified: Secondary | ICD-10-CM | POA: Diagnosis not present

## 2020-08-23 DIAGNOSIS — R531 Weakness: Secondary | ICD-10-CM | POA: Diagnosis not present

## 2020-08-23 DIAGNOSIS — Z85828 Personal history of other malignant neoplasm of skin: Secondary | ICD-10-CM | POA: Diagnosis not present

## 2020-08-23 DIAGNOSIS — S42256D Nondisplaced fracture of greater tuberosity of unspecified humerus, subsequent encounter for fracture with routine healing: Secondary | ICD-10-CM | POA: Diagnosis not present

## 2020-08-23 DIAGNOSIS — L905 Scar conditions and fibrosis of skin: Secondary | ICD-10-CM | POA: Diagnosis not present

## 2020-08-23 DIAGNOSIS — M25612 Stiffness of left shoulder, not elsewhere classified: Secondary | ICD-10-CM | POA: Diagnosis not present

## 2020-08-27 DIAGNOSIS — M25612 Stiffness of left shoulder, not elsewhere classified: Secondary | ICD-10-CM | POA: Diagnosis not present

## 2020-08-27 DIAGNOSIS — R531 Weakness: Secondary | ICD-10-CM | POA: Diagnosis not present

## 2020-08-27 DIAGNOSIS — S42256D Nondisplaced fracture of greater tuberosity of unspecified humerus, subsequent encounter for fracture with routine healing: Secondary | ICD-10-CM | POA: Diagnosis not present

## 2020-08-30 DIAGNOSIS — M25612 Stiffness of left shoulder, not elsewhere classified: Secondary | ICD-10-CM | POA: Diagnosis not present

## 2020-08-30 DIAGNOSIS — R531 Weakness: Secondary | ICD-10-CM | POA: Diagnosis not present

## 2020-08-30 DIAGNOSIS — S42256D Nondisplaced fracture of greater tuberosity of unspecified humerus, subsequent encounter for fracture with routine healing: Secondary | ICD-10-CM | POA: Diagnosis not present

## 2020-09-05 DIAGNOSIS — R531 Weakness: Secondary | ICD-10-CM | POA: Diagnosis not present

## 2020-09-05 DIAGNOSIS — S42256D Nondisplaced fracture of greater tuberosity of unspecified humerus, subsequent encounter for fracture with routine healing: Secondary | ICD-10-CM | POA: Diagnosis not present

## 2020-09-05 DIAGNOSIS — M25612 Stiffness of left shoulder, not elsewhere classified: Secondary | ICD-10-CM | POA: Diagnosis not present

## 2020-09-10 DIAGNOSIS — M25612 Stiffness of left shoulder, not elsewhere classified: Secondary | ICD-10-CM | POA: Diagnosis not present

## 2020-09-10 DIAGNOSIS — R531 Weakness: Secondary | ICD-10-CM | POA: Diagnosis not present

## 2020-09-10 DIAGNOSIS — S42256D Nondisplaced fracture of greater tuberosity of unspecified humerus, subsequent encounter for fracture with routine healing: Secondary | ICD-10-CM | POA: Diagnosis not present

## 2020-09-19 DIAGNOSIS — M25612 Stiffness of left shoulder, not elsewhere classified: Secondary | ICD-10-CM | POA: Diagnosis not present

## 2020-09-19 DIAGNOSIS — S42256D Nondisplaced fracture of greater tuberosity of unspecified humerus, subsequent encounter for fracture with routine healing: Secondary | ICD-10-CM | POA: Diagnosis not present

## 2020-09-19 DIAGNOSIS — R531 Weakness: Secondary | ICD-10-CM | POA: Diagnosis not present

## 2020-09-24 ENCOUNTER — Other Ambulatory Visit: Payer: Self-pay

## 2020-09-24 ENCOUNTER — Ambulatory Visit
Admission: RE | Admit: 2020-09-24 | Discharge: 2020-09-24 | Disposition: A | Discharge status: Home / Self Care | Payer: BC Managed Care – PPO | Source: Ambulatory Visit | Attending: Internal Medicine | Admitting: Internal Medicine

## 2020-09-24 DIAGNOSIS — Z1231 Encounter for screening mammogram for malignant neoplasm of breast: Secondary | ICD-10-CM | POA: Diagnosis not present

## 2020-09-25 DIAGNOSIS — R531 Weakness: Secondary | ICD-10-CM | POA: Diagnosis not present

## 2020-09-25 DIAGNOSIS — M25612 Stiffness of left shoulder, not elsewhere classified: Secondary | ICD-10-CM | POA: Diagnosis not present

## 2020-09-25 DIAGNOSIS — S42256D Nondisplaced fracture of greater tuberosity of unspecified humerus, subsequent encounter for fracture with routine healing: Secondary | ICD-10-CM | POA: Diagnosis not present

## 2020-09-27 DIAGNOSIS — M25612 Stiffness of left shoulder, not elsewhere classified: Secondary | ICD-10-CM | POA: Diagnosis not present

## 2020-09-27 DIAGNOSIS — R531 Weakness: Secondary | ICD-10-CM | POA: Diagnosis not present

## 2020-09-27 DIAGNOSIS — S42256D Nondisplaced fracture of greater tuberosity of unspecified humerus, subsequent encounter for fracture with routine healing: Secondary | ICD-10-CM | POA: Diagnosis not present

## 2020-09-28 DIAGNOSIS — M25612 Stiffness of left shoulder, not elsewhere classified: Secondary | ICD-10-CM | POA: Diagnosis not present

## 2020-10-01 DIAGNOSIS — M25612 Stiffness of left shoulder, not elsewhere classified: Secondary | ICD-10-CM | POA: Diagnosis not present

## 2020-10-01 DIAGNOSIS — R531 Weakness: Secondary | ICD-10-CM | POA: Diagnosis not present

## 2020-10-01 DIAGNOSIS — S42256D Nondisplaced fracture of greater tuberosity of unspecified humerus, subsequent encounter for fracture with routine healing: Secondary | ICD-10-CM | POA: Diagnosis not present

## 2020-10-04 DIAGNOSIS — R531 Weakness: Secondary | ICD-10-CM | POA: Diagnosis not present

## 2020-10-04 DIAGNOSIS — S42256D Nondisplaced fracture of greater tuberosity of unspecified humerus, subsequent encounter for fracture with routine healing: Secondary | ICD-10-CM | POA: Diagnosis not present

## 2020-10-04 DIAGNOSIS — M25612 Stiffness of left shoulder, not elsewhere classified: Secondary | ICD-10-CM | POA: Diagnosis not present

## 2020-10-08 DIAGNOSIS — S42256D Nondisplaced fracture of greater tuberosity of unspecified humerus, subsequent encounter for fracture with routine healing: Secondary | ICD-10-CM | POA: Diagnosis not present

## 2020-10-08 DIAGNOSIS — R531 Weakness: Secondary | ICD-10-CM | POA: Diagnosis not present

## 2020-10-08 DIAGNOSIS — M25612 Stiffness of left shoulder, not elsewhere classified: Secondary | ICD-10-CM | POA: Diagnosis not present

## 2020-10-10 DIAGNOSIS — M25612 Stiffness of left shoulder, not elsewhere classified: Secondary | ICD-10-CM | POA: Diagnosis not present

## 2020-10-10 DIAGNOSIS — R531 Weakness: Secondary | ICD-10-CM | POA: Diagnosis not present

## 2020-10-10 DIAGNOSIS — S42256D Nondisplaced fracture of greater tuberosity of unspecified humerus, subsequent encounter for fracture with routine healing: Secondary | ICD-10-CM | POA: Diagnosis not present

## 2020-10-15 DIAGNOSIS — R531 Weakness: Secondary | ICD-10-CM | POA: Diagnosis not present

## 2020-10-15 DIAGNOSIS — S42256D Nondisplaced fracture of greater tuberosity of unspecified humerus, subsequent encounter for fracture with routine healing: Secondary | ICD-10-CM | POA: Diagnosis not present

## 2020-10-15 DIAGNOSIS — M25612 Stiffness of left shoulder, not elsewhere classified: Secondary | ICD-10-CM | POA: Diagnosis not present

## 2020-10-17 DIAGNOSIS — M25612 Stiffness of left shoulder, not elsewhere classified: Secondary | ICD-10-CM | POA: Diagnosis not present

## 2020-10-17 DIAGNOSIS — S42256D Nondisplaced fracture of greater tuberosity of unspecified humerus, subsequent encounter for fracture with routine healing: Secondary | ICD-10-CM | POA: Diagnosis not present

## 2020-10-17 DIAGNOSIS — R531 Weakness: Secondary | ICD-10-CM | POA: Diagnosis not present

## 2020-10-17 NOTE — Progress Notes (Signed)
70 y.o. [place] White or Caucasian Not Hispanic or Latino female here for annual exam.  H/O TAH/BSO. Not sexually active, no bowel or bladder c/o.   She broke her shoulder and arm in 11/21, she fell walking on an asphalt road. She is seeing an Doctor, general practice and he told her that she is healing very well.   No other medical changes.    No LMP recorded (lmp unknown). Patient has had a hysterectomy.          Sexually active: No.  The current method of family planning is status post hysterectomy.    Exercising: No.  The patient does not participate in regular exercise at present. Smoker:  no  Health Maintenance: Pap:  Unsure  History of abnormal Pap:  no MMG:  09/27/20 density C bi-rads 1 neg  BMD:   07/06/19 Osteopenic, spine -2.3, FRAX 12.9/1%  Colonoscopy: 10/28/19 f/u 5 years TDaP:  03/13/10 Gardasil: NA   reports that she quit smoking about 46 years ago. Her smoking use included cigarettes. She has never used smokeless tobacco. She reports current alcohol use of about 3.0 - 4.0 standard drinks of alcohol per week. She reports that she does not use drugs. 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 No children  Past Medical History:  Diagnosis Date  . ALLERGIC RHINITIS 04/04/2007  . Allergy   . BURSITIS, RIGHT HIP 04/04/2007  . COLONIC POLYPS, HX OF 04/04/2007  . FATIGUE 05/12/2007  . HYPERLIPIDEMIA 04/04/2007  . LATERAL EPICONDYLITIS, RIGHT 03/13/2010  . 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 OSTEOARTHRITIS 04/04/2007  . OSTEOARTHRITIS, KNEE, RIGHT 02/12/2009  . OSTEOPENIA 03/13/2010  . Palpitations 05/12/2007   stress/anxiety related  . Unspecified vitamin D deficiency 02/12/2009    Past Surgical History:  Procedure Laterality Date  . ABDOMINAL HYSTERECTOMY  2002  . BLEPHAROPLASTY Bilateral 2015  . COLONOSCOPY  09/29/2014   Carlean Purl- ssp polyp  . COSMETIC SURGERY  2014   eye lift  . DENTAL SURGERY  08/2019   perm implant  . hemorrhoidectomoy  1988  . kidney stone  removal  2005  . OOPHORECTOMY  2002    Current Outpatient Medications  Medication Sig Dispense Refill  . atorvastatin (LIPITOR) 80 MG tablet Take 1 tablet (80 mg total) by mouth daily. 90 tablet 3  . Calcium Carb-Cholecalciferol (CALCIUM 500 + D3 PO) Take by mouth.    . Cholecalciferol (VITAMIN D) 2000 units tablet Take 1 tablet (2,000 Units total) by mouth daily. 30 tablet 11  . Magnesium Oxide 250 MG TABS Take 250 mg by mouth daily.    . vitamin C (ASCORBIC ACID) 500 MG tablet Take 500 mg by mouth daily.    Marland Kitchen VITAMIN K PO Take 20 mcg by mouth daily.     No current facility-administered medications for this visit.    Family History  Problem Relation Age of Onset  . Gastric cancer Paternal Grandfather   . Heart disease Mother        died at 57yo  . Heart disease Father        died at 69yo  . Stomach cancer Maternal Grandfather   . Colon cancer Neg Hx   . Rectal cancer Neg Hx   . Esophageal cancer Neg Hx     Review of Systems  All other systems reviewed and are negative.   Exam:   BP 140/80   Pulse 78   Wt 193 lb (87.5 kg)   LMP  (LMP Unknown)   SpO2 99%  BMI 33.65 kg/m   Weight change: @WEIGHTCHANGE @ Height:      Ht Readings from Last 3 Encounters:  01/10/20 5' 3.5" (1.613 m)  10/28/19 5' 3.5" (1.613 m)  10/14/19 5\' 3"  (1.6 m)    General appearance: alert, cooperative and appears stated age Head: Normocephalic, without obvious abnormality, atraumatic Neck: no adenopathy, supple, symmetrical, trachea midline and thyroid normal to inspection and palpation Lungs: clear to auscultation bilaterally Cardiovascular: regular rate and rhythm Breasts: normal appearance, no masses or tenderness, bilaterally inverted nipples (long term) Abdomen: soft, non-tender; non distended,  no masses,  no organomegaly Extremities: extremities normal, atraumatic, no cyanosis or edema Skin: Skin color, texture, turgor normal. No rashes or lesions Lymph nodes: Cervical, supraclavicular,  and axillary nodes normal. No abnormal inguinal nodes palpated Neurologic: Grossly normal   Pelvic: External genitalia:  no lesions              Urethra:  normal appearing urethra with no masses, tenderness or lesions              Bartholins and Skenes: normal                 Vagina: atrophic appearing vagina with normal color and discharge, no lesions              Cervix: absent               Bimanual Exam:  Uterus:  uterus absent              Adnexa: no mass, fullness, tenderness               Rectovaginal: Confirms               Anus:  normal sphincter tone, no lesions  Gae Dry chaperoned for the exam.   1. Well woman exam Discussed breast self exam Labs with primary  2. Osteopenia, unspecified location She fell in 11/21 and fractured her arm/shoulder DEXA in 12/22 Discussed calcium and vit D intake - DG Bone Density; Future

## 2020-10-18 ENCOUNTER — Ambulatory Visit: Payer: BC Managed Care – PPO | Admitting: Obstetrics and Gynecology

## 2020-10-18 ENCOUNTER — Encounter: Payer: Self-pay | Admitting: Obstetrics and Gynecology

## 2020-10-18 ENCOUNTER — Other Ambulatory Visit: Payer: Self-pay

## 2020-10-18 VITALS — BP 140/80 | HR 78 | Wt 193.0 lb

## 2020-10-18 DIAGNOSIS — M8580 Other specified disorders of bone density and structure, unspecified site: Secondary | ICD-10-CM | POA: Diagnosis not present

## 2020-10-18 DIAGNOSIS — Z01419 Encounter for gynecological examination (general) (routine) without abnormal findings: Secondary | ICD-10-CM | POA: Diagnosis not present

## 2020-10-18 NOTE — Patient Instructions (Signed)
EXERCISE   We recommended that you start or continue a regular exercise program for good health. Physical activity is anything that gets your body moving, some is better than none. The CDC recommends 150 minutes per week of Moderate-Intensity Aerobic Activity and 2 or more days of Muscle Strengthening Activity.  Benefits of exercise are limitless: helps weight loss/weight maintenance, improves mood and energy, helps with depression and anxiety, improves sleep, tones and strengthens muscles, improves balance, improves bone density, protects from chronic conditions such as heart disease, high blood pressure and diabetes and so much more. To learn more visit: https://www.cdc.gov/physicalactivity/index.html  DIET: Good nutrition starts with a healthy diet of fruits, vegetables, whole grains, and lean protein sources. Drink plenty of water for hydration. Minimize empty calories, sodium, sweets. For more information about dietary recommendations visit: https://health.gov/our-work/nutrition-physical-activity/dietary-guidelines and https://www.myplate.gov/  ALCOHOL:  Women should limit their alcohol intake to no more than 7 drinks/beers/glasses of wine (combined, not each!) per week. Moderation of alcohol intake to this level decreases your risk of breast cancer and liver damage.  If you are concerned that you may have a problem, or your friends have told you they are concerned about your drinking, there are many resources to help. A well-known program that is free, effective, and available to all people all over the nation is Alcoholics Anonymous.  Check out this site to learn more: https://www.aa.org/   CALCIUM AND VITAMIN D:  Adequate intake of calcium and Vitamin D are recommended for bone health.  You should be getting between 1000-1200 mg of calcium and 800 units of Vitamin D daily between diet and supplements  PAP SMEARS:  Pap smears, to check for cervical cancer or precancers,  have traditionally been  done yearly, scientific advances have shown that most women can have pap smears less often.  However, every woman still should have a physical exam from her gynecologist every year. It will include a breast check, inspection of the vulva and vagina to check for abnormal growths or skin changes, a visual exam of the cervix, and then an exam to evaluate the size and shape of the uterus and ovaries. We will also provide age appropriate advice regarding health maintenance, like when you should have certain vaccines, screening for sexually transmitted diseases, bone density testing, colonoscopy, mammograms, etc.   MAMMOGRAMS:  All women over 40 years old should have a routine mammogram.   COLON CANCER SCREENING: Now recommend starting at age 45. At this time colonoscopy is not covered for routine screening until 50. There are take home tests that can be done between 45-49.   COLONOSCOPY:  Colonoscopy to screen for colon cancer is recommended for all women at age 50.  We know, you hate the idea of the prep.  We agree, BUT, having colon cancer and not knowing it is worse!!  Colon cancer so often starts as a polyp that can be seen and removed at colonscopy, which can quite literally save your life!  And if your first colonoscopy is normal and you have no family history of colon cancer, most women don't have to have it again for 10 years.  Once every ten years, you can do something that may end up saving your life, right?  We will be happy to help you get it scheduled when you are ready.  Be sure to check your insurance coverage so you understand how much it will cost.  It may be covered as a preventative service at no cost, but you should check   your particular policy.      Breast Self-Awareness Breast self-awareness means being familiar with how your breasts look and feel. It involves checking your breasts regularly and reporting any changes to your health care provider. Practicing breast self-awareness is  important. A change in your breasts can be a sign of a serious medical problem. Being familiar with how your breasts look and feel allows you to find any problems early, when treatment is more likely to be successful. All women should practice breast self-awareness, including women who have had breast implants. How to do a breast self-exam One way to learn what is normal for your breasts and whether your breasts are changing is to do a breast self-exam. To do a breast self-exam: Look for Changes  1. Remove all the clothing above your waist. 2. Stand in front of a mirror in a room with good lighting. 3. Put your hands on your hips. 4. Push your hands firmly downward. 5. Compare your breasts in the mirror. Look for differences between them (asymmetry), such as: ? Differences in shape. ? Differences in size. ? Puckers, dips, and bumps in one breast and not the other. 6. Look at each breast for changes in your skin, such as: ? Redness. ? Scaly areas. 7. Look for changes in your nipples, such as: ? Discharge. ? Bleeding. ? Dimpling. ? Redness. ? A change in position. Feel for Changes Carefully feel your breasts for lumps and changes. It is best to do this while lying on your back on the floor and again while sitting or standing in the shower or tub with soapy water on your skin. Feel each breast in the following way:  Place the arm on the side of the breast you are examining above your head.  Feel your breast with the other hand.  Start in the nipple area and make  inch (2 cm) overlapping circles to feel your breast. Use the pads of your three middle fingers to do this. Apply light pressure, then medium pressure, then firm pressure. The light pressure will allow you to feel the tissue closest to the skin. The medium pressure will allow you to feel the tissue that is a little deeper. The firm pressure will allow you to feel the tissue close to the ribs.  Continue the overlapping circles,  moving downward over the breast until you feel your ribs below your breast.  Move one finger-width toward the center of the body. Continue to use the  inch (2 cm) overlapping circles to feel your breast as you move slowly up toward your collarbone.  Continue the up and down exam using all three pressures until you reach your armpit.  Write Down What You Find  Write down what is normal for each breast and any changes that you find. Keep a written record with breast changes or normal findings for each breast. By writing this information down, you do not need to depend only on memory for size, tenderness, or location. Write down where you are in your menstrual cycle, if you are still menstruating. If you are having trouble noticing differences in your breasts, do not get discouraged. With time you will become more familiar with the variations in your breasts and more comfortable with the exam. How often should I examine my breasts? Examine your breasts every month. If you are breastfeeding, the best time to examine your breasts is after a feeding or after using a breast pump. If you menstruate, the best time to   examine your breasts is 5-7 days after your period is over. During your period, your breasts are lumpier, and it may be more difficult to notice changes. When should I see my health care provider? See your health care provider if you notice:  A change in shape or size of your breasts or nipples.  A change in the skin of your breast or nipples, such as a reddened or scaly area.  Unusual discharge from your nipples.  A lump or thick area that was not there before.  Pain in your breasts.  Anything that concerns you.   Osteopenia  Osteopenia is a loss of thickness (density) inside the bones. Another name for osteopenia is low bone mass. Mild osteopenia is a normal part of aging. It is not a disease, and it does not cause symptoms. However, if you have osteopenia and continue to lose  bone mass, you could develop a condition that causes the bones to become thin and break more easily (osteoporosis). Osteoporosis can cause you to lose some height, have back pain, and have a stooped posture. Although osteopenia is not a disease, making changes to your lifestyle and diet can help to prevent osteopenia from developing into osteoporosis. What are the causes? Osteopenia is caused by loss of calcium in the bones. Bones are constantly changing. Old bone cells are continually being replaced with new bone cells. This process builds new bone. The mineral calcium is needed to build new bone and maintain bone density. Bone density is usually highest around age 35. After that, most people's bodies cannot replace all the bone they have lost with new bone. What increases the risk? You are more likely to develop this condition if:  You are older than age 50.  You are a woman who went through menopause early.  You have a long illness that keeps you in bed.  You do not get enough exercise.  You lack certain nutrients (malnutrition).  You have an overactive thyroid gland (hyperthyroidism).  You use products that contain nicotine or tobacco, such as cigarettes, e-cigarettes and chewing tobacco, or you drink a lot of alcohol.  You are taking medicines that weaken the bones, such as steroids. What are the signs or symptoms? This condition does not cause any symptoms. You may have a slightly higher risk for bone breaks (fractures), so getting fractures more easily than normal may be an indication of osteopenia. How is this diagnosed? This condition may be diagnosed based on an X-ray exam that measures bone density (dual-energy X-ray absorptiometry, or DEXA). This test can measure bone density in your hips, spine, and wrists. Osteopenia has no symptoms, so this condition is usually diagnosed after a routine bone density screening test is done for osteoporosis. This routine screening is usually  done for:  Women who are age 65 or older.  Men who are age 70 or older. If you have risk factors for osteopenia, you may have the screening test at an earlier age. How is this treated? Making dietary and lifestyle changes can lower your risk for osteoporosis. If you have severe osteopenia that is close to becoming osteoporosis, this condition can be treated with medicines and dietary supplements such as calcium and vitamin D. These supplements help to rebuild bone density. Follow these instructions at home: Eating and drinking Eat a diet that is high in calcium and vitamin D.  Calcium is found in dairy products, beans, salmon, and leafy green vegetables like spinach and broccoli.  Look for foods that   vitamin D and calcium added to them (fortified foods), such as orange juice, cereal, and bread.   Lifestyle  Do 30 minutes or more of a weight-bearing exercise every day, such as walking, jogging, or playing a sport. These types of exercises strengthen the bones.  Do not use any products that contain nicotine or tobacco, such as cigarettes, e-cigarettes, and chewing tobacco. If you need help quitting, ask your health care provider.  Do not drink alcohol if: ? Your health care provider tells you not to drink. ? You are pregnant, may be pregnant, or are planning to become pregnant.  If you drink alcohol: ? Limit how much you use to:  0-1 drink a day for women.  0-2 drinks a day for men. ? Be aware of how much alcohol is in your drink. In the [place] one drink equals one 12 oz bottle of beer (355 mL), one 5 oz glass of wine (148 mL), or one 1 oz glass of hard liquor (44 mL). General instructions  Take over-the-counter and prescription medicines only as told by your health care provider. These include vitamins and supplements.  Take precautions at home to lower your risk of falling, such as: ? Keeping rooms well-lit and free of clutter, such as cords. ? Installing safety rails on  stairs. ? Using rubber mats in the bathroom or other areas that are often wet or slippery.  Keep all follow-up visits. This is important. Contact a health care provider if:  You have not had a bone density screening for osteoporosis and you are: ? A woman who is age 81 or older. ? A man who is age 38 or older.  You are a postmenopausal woman who has not had a bone density screening for osteoporosis.  You are older than age 15 and you want to know if you should have bone density screening for osteoporosis. Summary  Osteopenia is a loss of thickness (density) inside the bones. Another name for osteopenia is low bone mass.  Osteopenia is not a disease, but it may increase your risk for a condition that causes the bones to become thin and break more easily (osteoporosis).  You may be at risk for osteopenia if you are older than age 43 or if you are a woman who went through early menopause.  Osteopenia does not cause any symptoms, but it can be diagnosed with a bone density screening test.  Dietary and lifestyle changes are the first treatment for osteopenia. These may lower your risk for osteoporosis. This information is not intended to replace advice given to you by your health care provider. Make sure you discuss any questions you have with your health care provider. Document Revised: 01/05/2020 Document Reviewed: 01/05/2020 Elsevier Patient Education  [employer].

## 2020-11-09 DIAGNOSIS — M25612 Stiffness of left shoulder, not elsewhere classified: Secondary | ICD-10-CM | POA: Diagnosis not present

## 2021-01-03 ENCOUNTER — Other Ambulatory Visit: Payer: Self-pay

## 2021-01-03 ENCOUNTER — Other Ambulatory Visit (INDEPENDENT_AMBULATORY_CARE_PROVIDER_SITE_OTHER): Payer: BC Managed Care – PPO

## 2021-01-03 DIAGNOSIS — R739 Hyperglycemia, unspecified: Secondary | ICD-10-CM | POA: Diagnosis not present

## 2021-01-03 DIAGNOSIS — E559 Vitamin D deficiency, unspecified: Secondary | ICD-10-CM

## 2021-01-03 DIAGNOSIS — E538 Deficiency of other specified B group vitamins: Secondary | ICD-10-CM

## 2021-01-03 DIAGNOSIS — Z0000 Encounter for general adult medical examination without abnormal findings: Secondary | ICD-10-CM

## 2021-01-03 LAB — CBC WITH DIFFERENTIAL/PLATELET
Basophils Absolute: 0 10*3/uL (ref 0.0–0.1)
Basophils Relative: 0.5 % (ref 0.0–3.0)
Eosinophils Absolute: 0.1 10*3/uL (ref 0.0–0.7)
Eosinophils Relative: 2.2 % (ref 0.0–5.0)
HCT: 38.8 % (ref 36.0–46.0)
Hemoglobin: 13.2 g/dL (ref 12.0–15.0)
Lymphocytes Relative: 18.9 % (ref 12.0–46.0)
Lymphs Abs: 1 10*3/uL (ref 0.7–4.0)
MCHC: 34.1 g/dL (ref 30.0–36.0)
MCV: 85.6 fl (ref 78.0–100.0)
Monocytes Absolute: 0.5 10*3/uL (ref 0.1–1.0)
Monocytes Relative: 10.3 % (ref 3.0–12.0)
Neutro Abs: 3.6 10*3/uL (ref 1.4–7.7)
Neutrophils Relative %: 68.1 % (ref 43.0–77.0)
Platelets: 215 10*3/uL (ref 150.0–400.0)
RBC: 4.54 Mil/uL (ref 3.87–5.11)
RDW: 13.6 % (ref 11.5–15.5)
WBC: 5.3 10*3/uL (ref 4.0–10.5)

## 2021-01-03 LAB — URINALYSIS, ROUTINE W REFLEX MICROSCOPIC
Bilirubin Urine: NEGATIVE
Hgb urine dipstick: NEGATIVE
Ketones, ur: NEGATIVE
Leukocytes,Ua: NEGATIVE
Nitrite: NEGATIVE
RBC / HPF: NONE SEEN (ref 0–?)
Specific Gravity, Urine: <=1.005 — AB (ref 1.000–1.030)
Total Protein, Urine: NEGATIVE
Urine Glucose: NEGATIVE
Urobilinogen, UA: 0.2 (ref 0.0–1.0)
WBC, UA: NONE SEEN (ref 0–?)
pH: 7 (ref 5.0–8.0)

## 2021-01-03 LAB — HEMOGLOBIN A1C: Hgb A1c MFr Bld: 5.7 % (ref 4.6–6.5)

## 2021-01-03 LAB — TSH: TSH: 3.29 u[IU]/mL (ref 0.35–4.50)

## 2021-01-03 LAB — VITAMIN B12: Vitamin B-12: 362 pg/mL (ref 211–911)

## 2021-01-03 LAB — VITAMIN D 25 HYDROXY (VIT D DEFICIENCY, FRACTURES): VITD: 94.39 ng/mL (ref 30.00–100.00)

## 2021-01-04 LAB — HEPATIC FUNCTION PANEL
ALT: 27 U/L (ref 0–35)
AST: 25 U/L (ref 0–37)
Albumin: 4 g/dL (ref 3.5–5.2)
Alkaline Phosphatase: 120 U/L — ABNORMAL HIGH (ref 39–117)
Bilirubin, Direct: 0.1 mg/dL (ref 0.0–0.3)
Total Bilirubin: 0.7 mg/dL (ref 0.2–1.2)
Total Protein: 6.8 g/dL (ref 6.0–8.3)

## 2021-01-04 LAB — BASIC METABOLIC PANEL
BUN: 14 mg/dL (ref 6–23)
CO2: 25 mEq/L (ref 19–32)
Calcium: 9.9 mg/dL (ref 8.4–10.5)
Chloride: 105 mEq/L (ref 96–112)
Creatinine, Ser: 0.66 mg/dL (ref 0.40–1.20)
GFR: 90.64 mL/min (ref >60.00)
Glucose, Bld: 83 mg/dL (ref 70–99)
Potassium: 4.6 mEq/L (ref 3.5–5.1)
Sodium: 142 mEq/L (ref 135–145)

## 2021-01-04 LAB — LIPID PANEL
Cholesterol: 166 mg/dL (ref 0–200)
HDL: 56.4 mg/dL (ref >39.00)
LDL Cholesterol: 89 mg/dL (ref 0–99)
NonHDL: 109.62
Total CHOL/HDL Ratio: 3
Triglycerides: 105 mg/dL (ref 0.0–149.0)
VLDL: 21 mg/dL (ref 0.0–40.0)

## 2021-01-05 ENCOUNTER — Encounter: Payer: Self-pay | Admitting: Internal Medicine

## 2021-01-10 ENCOUNTER — Encounter: Payer: Self-pay | Admitting: Internal Medicine

## 2021-01-10 ENCOUNTER — Other Ambulatory Visit: Payer: Self-pay

## 2021-01-10 ENCOUNTER — Ambulatory Visit (INDEPENDENT_AMBULATORY_CARE_PROVIDER_SITE_OTHER): Payer: BC Managed Care – PPO | Admitting: Internal Medicine

## 2021-01-10 VITALS — BP 126/77 | HR 79 | Temp 98.3°F | Ht 63.5 in | Wt 198.4 lb

## 2021-01-10 DIAGNOSIS — Z0001 Encounter for general adult medical examination with abnormal findings: Secondary | ICD-10-CM | POA: Diagnosis not present

## 2021-01-10 DIAGNOSIS — R739 Hyperglycemia, unspecified: Secondary | ICD-10-CM

## 2021-01-10 DIAGNOSIS — E559 Vitamin D deficiency, unspecified: Secondary | ICD-10-CM

## 2021-01-10 DIAGNOSIS — Z23 Encounter for immunization: Secondary | ICD-10-CM

## 2021-01-10 DIAGNOSIS — E7800 Pure hypercholesterolemia, unspecified: Secondary | ICD-10-CM | POA: Diagnosis not present

## 2021-01-10 DIAGNOSIS — J309 Allergic rhinitis, unspecified: Secondary | ICD-10-CM

## 2021-01-10 DIAGNOSIS — E785 Hyperlipidemia, unspecified: Secondary | ICD-10-CM | POA: Diagnosis not present

## 2021-01-10 NOTE — Patient Instructions (Signed)
You had the Tdap tetanus shot today  Please continue all other medications as before, and refills have been done if requested.  Please have the pharmacy call with any other refills you may need.  Please continue your efforts at being more active, low cholesterol diet, and weight control.  You are otherwise up to date with prevention measures today.  Please keep your appointments with your specialists as you may have planned  Please make an Appointment to return for your 1 year visit, or sooner if needed, with Lab testing by Appointment as well, to be done about 3-5 days before at the [facility] (so this is for TWO appointments - please see the scheduling desk as you leave)  Due to the ongoing Covid 19 pandemic, our lab now requires an appointment for any labs done at our office.  If you need labs done and do not have an appointment, please call our office ahead of time to schedule before presenting to the lab for your testing.

## 2021-01-10 NOTE — Progress Notes (Signed)
Patient ID: [name], female   DOB: 19-Apr-1951, 70 y.o.   MRN: [id number]         Chief Complaint:: wellness exam and hld, hyperglycemia, vit d deficiecy, allergies       HPI:  [name] is a 70 y.o. female here for wellness exam; due for Tdap o/w up to date with preventive referrals and immunizations                        Also Does have several wks ongoing nasal allergy symptoms with clearish congestion, itch and sneezing, without fever, pain, ST, cough, swelling or wheezing.  Taking Vit D.  Pt denies chest pain, increased sob or doe, wheezing, orthopnea, PND, increased LE swelling, palpitations, dizziness or syncope.  Trying to follow lower chol diet.   Pt denies polydipsia, polyuria, or new focal neuro s/s.  Denies worsening depressive symptoms, suicidal ideation, or panic;  Pt denies fever, wt loss, night sweats, loss of appetite, or other constitutional symptoms    Wt Readings from Last 3 Encounters:  01/10/21 198 lb 6.4 oz (90 kg)  10/18/20 193 lb (87.5 kg)  01/10/20 194 lb (88 kg)   BP Readings from Last 3 Encounters:  01/10/21 126/77  10/18/20 140/80  01/10/20 122/68   Immunization History  Administered Date(s) Administered   Fluad Quad(high Dose 65+) 03/31/2019, 04/19/2020   Influenza Split 06/02/2011, 06/02/2012   Influenza, Seasonal, Injecte, Preservative Fre 06/02/2013   Influenza,inj,Quad PF,6+ Mos 05/19/2014, 04/27/2015, 04/24/2016, 05/01/2017, 05/11/2018   PFIZER(Purple Top)SARS-COV-2 Vaccination 09/09/2019, 10/04/2019, 05/10/2020, 11/10/2020   Pneumococcal Conjugate-13 12/30/2018   Pneumococcal Polysaccharide-23 01/10/2020   Td 03/13/2010   Tdap 01/10/2021   Zoster Recombinat (Shingrix) 05/16/2019, 08/23/2019   Zoster, Live 07/11/2014   There are no preventive care reminders to display for this patient.     Past Medical History:  Diagnosis Date   ALLERGIC RHINITIS 04/04/2007   Allergy    BURSITIS, RIGHT HIP 04/04/2007   COLONIC POLYPS, HX OF  04/04/2007   FATIGUE 05/12/2007   HYPERLIPIDEMIA 04/04/2007   LATERAL EPICONDYLITIS, RIGHT 03/13/2010   MASTITIS 08/11/2008   OSTEOARTHRITIS 04/04/2007   OSTEOARTHRITIS, KNEE, RIGHT 02/12/2009   OSTEOPENIA 03/13/2010   Palpitations 05/12/2007   stress/anxiety related   Unspecified vitamin D deficiency 02/12/2009   Past Surgical History:  Procedure Laterality Date   ABDOMINAL HYSTERECTOMY  2002   BLEPHAROPLASTY Bilateral 2015   COLONOSCOPY  09/29/2014   Carlean Purl- ssp polyp   COSMETIC SURGERY  2014   eye lift   DENTAL SURGERY  08/2019   perm implant   hemorrhoidectomoy  1988   kidney stone removal  2005   OOPHORECTOMY  2002    reports that she quit smoking about 46 years ago. Her smoking use included cigarettes. She has never used smokeless tobacco. She reports current alcohol use of about 3.0 - 4.0 standard drinks of alcohol per week. She reports that she does not use drugs. family history includes Gastric cancer in her paternal grandfather; Heart disease in her father and mother; Stomach cancer in her maternal grandfather. Allergies  Allergen Reactions   Sulfonamide Derivatives Swelling   Current Outpatient Medications on File Prior to Visit  Medication Sig Dispense Refill   atorvastatin (LIPITOR) 80 MG tablet Take 1 tablet (80 mg total) by mouth daily. 90 tablet 3   Calcium Carb-Cholecalciferol (CALCIUM 500 + D3 PO) Take by mouth.     Cholecalciferol (VITAMIN D) 2000 units tablet Take 1 tablet (  2,000 Units total) by mouth daily. 30 tablet 11   Magnesium Oxide 250 MG TABS Take 250 mg by mouth daily.     vitamin C (ASCORBIC ACID) 500 MG tablet Take 500 mg by mouth daily.     VITAMIN K PO Take 20 mcg by mouth daily.     No current facility-administered medications on file prior to visit.        ROS:  All others reviewed and negative.  Objective        PE:  BP 126/77 (BP Location: Left Arm, Patient Position: Sitting, Cuff Size: Large)   Pulse 79   Temp 98.3 F (36.8 C) (Oral)    Ht 5' 3.5" (1.613 m)   Wt 198 lb 6.4 oz (90 kg)   LMP  (LMP Unknown)   SpO2 96%   BMI 34.59 kg/m                 Constitutional: Pt appears in NAD               HENT: Head: NCAT.                Right Ear: External ear normal.                 Left Ear: External ear normal.                Eyes: . Pupils are equal, round, and reactive to light. Conjunctivae and EOM are normal               Nose: without d/c or deformity               Neck: Neck supple. Gross normal ROM               Cardiovascular: Normal rate and regular rhythm.                 Pulmonary/Chest: Effort normal and breath sounds without rales or wheezing.                Abd:  Soft, NT, ND, + BS, no organomegaly               Neurological: Pt is alert. At baseline orientation, motor grossly intact               Skin: Skin is warm. No rashes, no other new lesions, LE edema - none               Psychiatric: Pt behavior is normal without agitation   Micro: none  Cardiac tracings I have personally interpreted today:  none  Pertinent Radiological findings (summarize): none   Lab Results  Component Value Date   WBC 5.3 01/03/2021   HGB 13.2 01/03/2021   HCT 38.8 01/03/2021   PLT 215.0 01/03/2021   GLUCOSE 83 01/03/2021   CHOL 166 01/03/2021   TRIG 105.0 01/03/2021   HDL 56.40 01/03/2021   LDLDIRECT 162.0 03/06/2010   LDLCALC 89 01/03/2021   ALT 27 01/03/2021   AST 25 01/03/2021   NA 142 01/03/2021   K 4.6 01/03/2021   CL 105 01/03/2021   CREATININE 0.66 01/03/2021   BUN 14 01/03/2021   CO2 25 01/03/2021   TSH 3.29 01/03/2021   INR 1.0 01/18/2019   HGBA1C 5.7 01/03/2021   Assessment/Plan:  [name] is a 70 y.o. White or Caucasian [1] female with  has a past medical history of ALLERGIC RHINITIS (04/04/2007), Allergy, BURSITIS, RIGHT  HIP (04/04/2007), COLONIC POLYPS, HX OF (04/04/2007), FATIGUE (05/12/2007), HYPERLIPIDEMIA (04/04/2007), LATERAL EPICONDYLITIS, RIGHT (03/13/2010), MASTITIS (08/11/2008),  OSTEOARTHRITIS (04/04/2007), OSTEOARTHRITIS, KNEE, RIGHT (02/12/2009), OSTEOPENIA (03/13/2010), Palpitations (05/12/2007), and Unspecified vitamin D deficiency (02/12/2009).  Encounter for well adult exam with abnormal findings Age and sex appropriate education and counseling updated with regular exercise and diet Referrals for preventative services - none needed Immunizations addressed - for Tdap Smoking counseling  - none needed Evidence for depression or other mood disorder - none significant Most recent labs reviewed. I have personally reviewed and have noted: 1) the patient's medical and social history 2) The patient's current medications and supplements 3) The patient's height, weight, and BMI have been recorded in the chart   Vitamin D deficiency Last vitamin D Lab Results  Component Value Date   VD25OH 94.39 01/03/2021   Stable, cont oral replacement  Hyperlipidemia Lab Results  Component Value Date   LDLCALC 89 01/03/2021   Stable, pt to continue current statin lipitor 80, goal ldl < 100   Hyperglycemia Lab Results  Component Value Date   HGBA1C 5.7 01/03/2021   Stable, pt to continue current medical treatment  - diet   Allergic rhinitis Mild, for add otc allegra and/or nasacort asd prn  Followup: Return in about 1 year (around 01/10/2022).  Cathlean Cower, MD 01/13/2021 8:01 PM [facility]

## 2021-01-13 ENCOUNTER — Encounter: Payer: Self-pay | Admitting: Internal Medicine

## 2021-01-13 MED ORDER — ATORVASTATIN CALCIUM 80 MG PO TABS: 80.0000 mg | ORAL_TABLET | Freq: Every day | ORAL | 3 refills | Status: DC

## 2021-01-13 NOTE — Assessment & Plan Note (Signed)
Lab Results  Component Value Date   LDLCALC 89 01/03/2021   Stable, pt to continue current statin lipitor 80, goal ldl < 100

## 2021-01-13 NOTE — Assessment & Plan Note (Signed)
Lab Results  Component Value Date   HGBA1C 5.7 01/03/2021   Stable, pt to continue current medical treatment  - diet

## 2021-01-13 NOTE — Assessment & Plan Note (Signed)
Mild, for add otc allegra and/or nasacort asd prn

## 2021-01-13 NOTE — Assessment & Plan Note (Signed)
Age and sex appropriate education and counseling updated with regular exercise and diet Referrals for preventative services - none needed Immunizations addressed - for Tdap Smoking counseling  - none needed Evidence for depression or other mood disorder - none significant Most recent labs reviewed. I have personally reviewed and have noted: 1) the patient's medical and social history 2) The patient's current medications and supplements 3) The patient's height, weight, and BMI have been recorded in the chart

## 2021-01-13 NOTE — Assessment & Plan Note (Signed)
Last vitamin D Lab Results  Component Value Date   VD25OH 94.39 01/03/2021   Stable, cont oral replacement

## 2021-04-09 ENCOUNTER — Ambulatory Visit (INDEPENDENT_AMBULATORY_CARE_PROVIDER_SITE_OTHER): Payer: BC Managed Care – PPO

## 2021-04-09 ENCOUNTER — Other Ambulatory Visit: Payer: Self-pay

## 2021-04-09 DIAGNOSIS — Z23 Encounter for immunization: Secondary | ICD-10-CM

## 2021-04-10 ENCOUNTER — Ambulatory Visit: Payer: BC Managed Care – PPO

## 2021-05-07 DIAGNOSIS — M722 Plantar fascial fibromatosis: Secondary | ICD-10-CM | POA: Diagnosis not present

## 2021-05-07 DIAGNOSIS — M25371 Other instability, right ankle: Secondary | ICD-10-CM | POA: Diagnosis not present

## 2021-05-07 DIAGNOSIS — M24571 Contracture, right ankle: Secondary | ICD-10-CM | POA: Diagnosis not present

## 2021-06-10 DIAGNOSIS — M722 Plantar fascial fibromatosis: Secondary | ICD-10-CM | POA: Diagnosis not present

## 2021-07-08 DIAGNOSIS — M722 Plantar fascial fibromatosis: Secondary | ICD-10-CM | POA: Diagnosis not present

## 2021-07-08 DIAGNOSIS — Q141 Congenital malformation of retina: Secondary | ICD-10-CM | POA: Diagnosis not present

## 2021-07-08 DIAGNOSIS — H2521 Age-related cataract, morgagnian type, right eye: Secondary | ICD-10-CM | POA: Diagnosis not present

## 2021-07-08 DIAGNOSIS — H04123 Dry eye syndrome of bilateral lacrimal glands: Secondary | ICD-10-CM | POA: Diagnosis not present

## 2021-07-22 ENCOUNTER — Ambulatory Visit
Admission: RE | Admit: 2021-07-22 | Discharge: 2021-07-22 | Disposition: A | Discharge status: Home / Self Care | Payer: BC Managed Care – PPO | Source: Ambulatory Visit | Attending: Obstetrics and Gynecology | Admitting: Obstetrics and Gynecology

## 2021-07-22 DIAGNOSIS — M85851 Other specified disorders of bone density and structure, right thigh: Secondary | ICD-10-CM | POA: Diagnosis not present

## 2021-07-22 DIAGNOSIS — M8580 Other specified disorders of bone density and structure, unspecified site: Secondary | ICD-10-CM

## 2021-07-22 DIAGNOSIS — Z780 Asymptomatic menopausal state: Secondary | ICD-10-CM | POA: Diagnosis not present

## 2021-08-14 ENCOUNTER — Other Ambulatory Visit: Payer: Self-pay | Admitting: Obstetrics and Gynecology

## 2021-08-14 DIAGNOSIS — Z1231 Encounter for screening mammogram for malignant neoplasm of breast: Secondary | ICD-10-CM

## 2021-08-28 DIAGNOSIS — M722 Plantar fascial fibromatosis: Secondary | ICD-10-CM | POA: Diagnosis not present

## 2021-09-25 ENCOUNTER — Ambulatory Visit: Payer: BC Managed Care – PPO

## 2021-10-01 ENCOUNTER — Ambulatory Visit
Admission: RE | Admit: 2021-10-01 | Discharge: 2021-10-01 | Disposition: A | Discharge status: Home / Self Care | Payer: BC Managed Care – PPO | Source: Ambulatory Visit | Attending: Obstetrics and Gynecology | Admitting: Obstetrics and Gynecology

## 2021-10-01 DIAGNOSIS — Z1231 Encounter for screening mammogram for malignant neoplasm of breast: Secondary | ICD-10-CM | POA: Diagnosis not present

## 2021-10-15 NOTE — Progress Notes (Signed)
71 y.o. G0P0000 Married White or Caucasian Not Hispanic or Latino female here for annual exam.  H/O TAH/BSO. ? ?No bowel or bladder c/o. 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  ?  ? ?No LMP recorded (lmp unknown). Patient has had a hysterectomy.          ?Sexually active: No.  ?The current method of family planning is status post hysterectomy.    ?Exercising: Yes.    Gym/ health club routine includes low impact aerobics. ?Smoker:  no ? ?Health Maintenance: ?Pap:  unsure  ?History of abnormal Pap:  no ?MMG:  10/02/21 density C Bi-rads 1 neg  ?BMD:  07/22/21 Osteopenic, T score -1.3 (spine excluded), FRAX 13.8/1.4%: 07/06/19 Osteopenic, spine -2.3, FRAX 12.9/1%  ?Colonoscopy: 10/28/19 f/u 5 years ?TDaP:  03/13/10 ?Gardasil: NA ? ? reports that she quit smoking about 47 years ago. Her smoking use included cigarettes. She has never used smokeless tobacco. She reports current alcohol use of about 3.0 - 4.0 standard drinks per week. She reports that she does not use drugs. 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 No children. ? ?Past Medical History:  ?Diagnosis Date  ? ALLERGIC RHINITIS 04/04/2007  ? Allergy   ? BURSITIS, RIGHT HIP 04/04/2007  ? COLONIC POLYPS, HX OF 04/04/2007  ? FATIGUE 05/12/2007  ? HYPERLIPIDEMIA 04/04/2007  ? LATERAL EPICONDYLITIS, RIGHT 03/13/2010  ? MASTITIS 08/11/2008  ? OSTEOARTHRITIS 04/04/2007  ? OSTEOARTHRITIS, KNEE, RIGHT 02/12/2009  ? OSTEOPENIA 03/13/2010  ? Palpitations 05/12/2007  ? stress/anxiety related  ? Unspecified vitamin D deficiency 02/12/2009  ? ? ?Past Surgical History:  ?Procedure Laterality Date  ? ABDOMINAL HYSTERECTOMY  2002  ? BLEPHAROPLASTY Bilateral 2015  ? COLONOSCOPY  09/29/2014  ? Gessner- ssp polyp  ? COSMETIC SURGERY  2014  ? eye lift  ? DENTAL SURGERY  08/2019  ? perm implant  ? hemorrhoidectomoy  1988  ? kidney stone removal  2005  ? OOPHORECTOMY  2002  ? ? ?Current Outpatient Medications  ?Medication Sig Dispense Refill  ? atorvastatin (LIPITOR) 80 MG tablet Take 1  tablet (80 mg total) by mouth daily. 90 tablet 3  ? Calcium Carb-Cholecalciferol (CALCIUM 500 + D3 PO) Take by mouth.    ? Cholecalciferol (VITAMIN D) 2000 units tablet Take 1 tablet (2,000 Units total) by mouth daily. 30 tablet 11  ? Magnesium Oxide 250 MG TABS Take 250 mg by mouth daily.    ? vitamin C (ASCORBIC ACID) 500 MG tablet Take 500 mg by mouth daily.    ? VITAMIN K PO Take 20 mcg by mouth daily.    ? ?No current facility-administered medications for this visit.  ? ? ?Family History  ?Problem Relation Age of Onset  ? Gastric cancer Paternal Grandfather   ? Heart disease Mother   ?     died at 37yo  ? Heart disease Father   ?     died at 74yo  ? Stomach cancer Maternal Grandfather   ? Colon cancer Neg Hx   ? Rectal cancer Neg Hx   ? Esophageal cancer Neg Hx   ? ? ?Review of Systems  ?All other systems reviewed and are negative. ? ?Exam:   ?BP 122/74   Pulse 61   Ht 5' 3.5" (1.613 m)   Wt 188 lb 12.8 oz (85.6 kg)   LMP  (LMP Unknown)   SpO2 99%   BMI 32.92 kg/m?   Weight change: '@WEIGHTCHANGE'$ @ Height:   Height: 5' 3.5" (161.3 cm)  ?Ht Readings from  Last 3 Encounters:  ?10/23/21 5' 3.5" (1.613 m)  ?01/10/21 5' 3.5" (1.613 m)  ?01/10/20 5' 3.5" (1.613 m)  ? ? ?General appearance: alert, cooperative and appears stated age ?Head: Normocephalic, without obvious abnormality, atraumatic ?Neck: no adenopathy, supple, symmetrical, trachea midline and thyroid normal to inspection and palpation ?Lungs: clear to auscultation bilaterally ?Cardiovascular: regular rate and rhythm ?Breasts: normal appearance, no masses or tenderness, bilaterally inverted nipples (no change) ?Abdomen: soft, non-tender; non distended,  no masses,  no organomegaly ?Extremities: extremities normal, atraumatic, no cyanosis or edema ?Skin: Skin color, texture, turgor normal. No rashes or lesions ?Lymph nodes: Cervical, supraclavicular, and axillary nodes normal. ?No abnormal inguinal nodes palpated ?Neurologic: Grossly normal ? ? ?Pelvic:  External genitalia:  no lesions ?             Urethra:  normal appearing urethra with no masses, tenderness or lesions ?             Bartholins and Skenes: normal    ?             Vagina: atrophic appearing vagina with normal color and discharge, no lesions ?             Cervix: absent ?              ?Bimanual Exam:  Uterus:  uterus absent ?             Adnexa: no mass, fullness, tenderness ?              Rectovaginal: Confirms ?              Anus:  normal sphincter tone, no lesions ? ?Caryn Bee, CMA chaperoned for the exam. ? ? ?1. Well woman exam ?Discussed breast self exam ?Mammogram and colonoscopy UTD ?Labs with primary ?No pap needed ? ?2. Osteopenia, unspecified location ?Discussed calcium and vit D intake ?Next dexa in 12/24 ? ? ?

## 2021-10-23 ENCOUNTER — Encounter: Payer: Self-pay | Admitting: Obstetrics and Gynecology

## 2021-10-23 ENCOUNTER — Ambulatory Visit (INDEPENDENT_AMBULATORY_CARE_PROVIDER_SITE_OTHER): Payer: BC Managed Care – PPO | Admitting: Obstetrics and Gynecology

## 2021-10-23 ENCOUNTER — Other Ambulatory Visit: Payer: Self-pay

## 2021-10-23 VITALS — BP 122/74 | HR 61 | Ht 63.5 in | Wt 188.8 lb

## 2021-10-23 DIAGNOSIS — Z01419 Encounter for gynecological examination (general) (routine) without abnormal findings: Secondary | ICD-10-CM

## 2021-10-23 DIAGNOSIS — M8580 Other specified disorders of bone density and structure, unspecified site: Secondary | ICD-10-CM

## 2021-10-23 NOTE — Patient Instructions (Signed)
Osteopenia ?Osteopenia is a loss of thickness (density) inside the bones. Another name for osteopenia is low bone mass. Mild osteopenia is a normal part of aging. It is not a disease, and it does not cause symptoms. ?However, if you have osteopenia and continue to lose bone mass, you could develop a condition that causes the bones to become thin and break more easily (osteoporosis). Osteoporosis can cause you to lose some height, have back pain, and have a stooped posture. Although osteopenia is not a disease, making changes to your lifestyle and diet can help to prevent osteopenia from developing into osteoporosis. ?What are the causes? ?Osteopenia is caused by loss of calcium in the bones. Bones are constantly changing. Old bone cells are continually being replaced with new bone cells. This process builds new bone. ?The mineral calcium is needed to build new bone and maintain bone density. Bone density is usually highest around age 37. After that, most people's bodies cannot replace all the bone they have lost with new bone. ?What increases the risk? ?You are more likely to develop this condition if: ?You are older than age 40. ?You are a woman who went through menopause early. ?You have a long illness that keeps you in bed. ?You do not get enough exercise. ?You lack certain nutrients (malnutrition). ?You have an overactive thyroid gland (hyperthyroidism). ?You use products that contain nicotine or tobacco, such as cigarettes, e-cigarettes and chewing tobacco, or you drink a lot of alcohol. ?You are taking medicines that weaken the bones, such as steroids. ?What are the signs or symptoms? ?This condition does not cause any symptoms. You may have a slightly higher risk for bone breaks (fractures), so getting fractures more easily than normal may be an indication of osteopenia. ?How is this diagnosed? ?This condition may be diagnosed based on an X-ray exam that measures bone density (dual-energy X-ray  absorptiometry, or DEXA). This test can measure bone density in your hips, spine, and wrists. ?Osteopenia has no symptoms, so this condition is usually diagnosed after a routine bone density screening test is done for osteoporosis. This routine screening is usually done for: ?Women who are age 8 or older. ?Men who are age 23 or older. ?If you have risk factors for osteopenia, you may have the screening test at an earlier age. ?How is this treated? ?Making dietary and lifestyle changes can lower your risk for osteoporosis. ?If you have severe osteopenia that is close to becoming osteoporosis, this condition can be treated with medicines and dietary supplements such as calcium and vitamin D. These supplements help to rebuild bone density. ?Follow these instructions at home: ?Eating and drinking ?Eat a diet that is high in calcium and vitamin D. ?Calcium is found in dairy products, beans, salmon, and leafy green vegetables like spinach and broccoli. ?Look for foods that have vitamin D and calcium added to them (fortified foods), such as orange juice, cereal, and bread. ? ?Lifestyle ?Do 30 minutes or more of a weight-bearing exercise every day, such as walking, jogging, or playing a sport. These types of exercises strengthen the bones. ?Do not use any products that contain nicotine or tobacco, such as cigarettes, e-cigarettes, and chewing tobacco. If you need help quitting, ask your health care provider. ?Do not drink alcohol if: ?Your health care provider tells you not to drink. ?You are pregnant, may be pregnant, or are planning to become pregnant. ?If you drink alcohol: ?Limit how much you use to: ?0-1 drink a day for women. ?0-2 drinks  a day for men. ?Be aware of how much alcohol is in your drink. In the [place] one drink equals one 12 oz bottle of beer (355 mL), one 5 oz glass of wine (148 mL), or one 1? oz glass of hard liquor (44 mL). ?General instructions ?Take over-the-counter and prescription medicines only as  told by your health care provider. These include vitamins and supplements. ?Take precautions at home to lower your risk of falling, such as: ?Keeping rooms well-lit and free of clutter, such as cords. ?Installing safety rails on stairs. ?Using rubber mats in the bathroom or other areas that are often wet or slippery. ?Keep all follow-up visits. This is important. ?Contact a health care provider if: ?You have not had a bone density screening for osteoporosis and you are: ?A woman who is age 65 or older. ?A man who is age 58 or older. ?You are a postmenopausal woman who has not had a bone density screening for osteoporosis. ?You are older than age 54 and you want to know if you should have bone density screening for osteoporosis. ?Summary ?Osteopenia is a loss of thickness (density) inside the bones. Another name for osteopenia is low bone mass. ?Osteopenia is not a disease, but it may increase your risk for a condition that causes the bones to become thin and break more easily (osteoporosis). ?You may be at risk for osteopenia if you are older than age 68 or if you are a woman who went through early menopause. ?Osteopenia does not cause any symptoms, but it can be diagnosed with a bone density screening test. ?Dietary and lifestyle changes are the first treatment for osteopenia. These may lower your risk for osteoporosis. ?This information is not intended to replace advice given to you by your health care provider. Make sure you discuss any questions you have with your health care provider. ?Document Revised: 01/05/2020 Document Reviewed: 01/05/2020 ?Elsevier Patient Education ? [place]. ? ?

## 2022-01-09 ENCOUNTER — Other Ambulatory Visit (INDEPENDENT_AMBULATORY_CARE_PROVIDER_SITE_OTHER): Payer: BC Managed Care – PPO

## 2022-01-09 ENCOUNTER — Telehealth: Payer: Self-pay

## 2022-01-09 DIAGNOSIS — E559 Vitamin D deficiency, unspecified: Secondary | ICD-10-CM | POA: Diagnosis not present

## 2022-01-09 DIAGNOSIS — E7800 Pure hypercholesterolemia, unspecified: Secondary | ICD-10-CM | POA: Diagnosis not present

## 2022-01-09 DIAGNOSIS — R739 Hyperglycemia, unspecified: Secondary | ICD-10-CM

## 2022-01-09 LAB — LIPID PANEL
Cholesterol: 255 mg/dL — ABNORMAL HIGH (ref 0–200)
HDL: 58.6 mg/dL (ref >39.00)
LDL Cholesterol: 166 mg/dL — ABNORMAL HIGH (ref 0–99)
NonHDL: 196.65
Total CHOL/HDL Ratio: 4
Triglycerides: 151 mg/dL — ABNORMAL HIGH (ref 0.0–149.0)
VLDL: 30.2 mg/dL (ref 0.0–40.0)

## 2022-01-09 LAB — HEPATIC FUNCTION PANEL
ALT: 23 U/L (ref 0–35)
AST: 21 U/L (ref 0–37)
Albumin: 4 g/dL (ref 3.5–5.2)
Alkaline Phosphatase: 103 U/L (ref 39–117)
Bilirubin, Direct: 0.1 mg/dL (ref 0.0–0.3)
Total Bilirubin: 0.7 mg/dL (ref 0.2–1.2)
Total Protein: 6.9 g/dL (ref 6.0–8.3)

## 2022-01-09 LAB — CBC WITH DIFFERENTIAL/PLATELET
Basophils Absolute: 0 10*3/uL (ref 0.0–0.1)
Basophils Relative: 0.5 % (ref 0.0–3.0)
Eosinophils Absolute: 0.1 10*3/uL (ref 0.0–0.7)
Eosinophils Relative: 1.8 % (ref 0.0–5.0)
HCT: 41.9 % (ref 36.0–46.0)
Hemoglobin: 14.1 g/dL (ref 12.0–15.0)
Lymphocytes Relative: 16.5 % (ref 12.0–46.0)
Lymphs Abs: 0.9 10*3/uL (ref 0.7–4.0)
MCHC: 33.8 g/dL (ref 30.0–36.0)
MCV: 88 fl (ref 78.0–100.0)
Monocytes Absolute: 0.6 10*3/uL (ref 0.1–1.0)
Monocytes Relative: 10.9 % (ref 3.0–12.0)
Neutro Abs: 3.9 10*3/uL (ref 1.4–7.7)
Neutrophils Relative %: 70.3 % (ref 43.0–77.0)
Platelets: 195 10*3/uL (ref 150.0–400.0)
RBC: 4.76 Mil/uL (ref 3.87–5.11)
RDW: 13.5 % (ref 11.5–15.5)
WBC: 5.6 10*3/uL (ref 4.0–10.5)

## 2022-01-09 LAB — URINALYSIS, ROUTINE W REFLEX MICROSCOPIC
Bilirubin Urine: NEGATIVE
Hgb urine dipstick: NEGATIVE
Ketones, ur: NEGATIVE
Leukocytes,Ua: NEGATIVE
Nitrite: NEGATIVE
Specific Gravity, Urine: >=1.03 — AB (ref 1.000–1.030)
Total Protein, Urine: NEGATIVE
Urine Glucose: NEGATIVE
Urobilinogen, UA: 0.2 (ref 0.0–1.0)
pH: 5.5 (ref 5.0–8.0)

## 2022-01-09 LAB — BASIC METABOLIC PANEL
BUN: 15 mg/dL (ref 6–23)
CO2: 29 mEq/L (ref 19–32)
Calcium: 9.9 mg/dL (ref 8.4–10.5)
Chloride: 104 mEq/L (ref 96–112)
Creatinine, Ser: 0.65 mg/dL (ref 0.40–1.20)
GFR: 90.33 mL/min (ref >60.00)
Glucose, Bld: 93 mg/dL (ref 70–99)
Potassium: 4.3 mEq/L (ref 3.5–5.1)
Sodium: 140 mEq/L (ref 135–145)

## 2022-01-09 LAB — TSH: TSH: 2.77 u[IU]/mL (ref 0.35–5.50)

## 2022-01-09 LAB — VITAMIN D 25 HYDROXY (VIT D DEFICIENCY, FRACTURES): VITD: 114.43 ng/mL (ref 30.00–100.00)

## 2022-01-09 LAB — HEMOGLOBIN A1C: Hgb A1c MFr Bld: 5.6 % (ref 4.6–6.5)

## 2022-01-09 NOTE — Telephone Encounter (Signed)
Saa with the lab at [facility] on [address] calling to report a critical Vitamin D level for patient at 114.43. Provider notified.

## 2022-01-15 ENCOUNTER — Encounter: Payer: Self-pay | Admitting: Internal Medicine

## 2022-01-15 ENCOUNTER — Ambulatory Visit (INDEPENDENT_AMBULATORY_CARE_PROVIDER_SITE_OTHER): Payer: BC Managed Care – PPO | Admitting: Internal Medicine

## 2022-01-15 VITALS — BP 118/76 | HR 72 | Temp 98.1°F | Ht 63.5 in | Wt 183.0 lb

## 2022-01-15 DIAGNOSIS — R739 Hyperglycemia, unspecified: Secondary | ICD-10-CM

## 2022-01-15 DIAGNOSIS — E785 Hyperlipidemia, unspecified: Secondary | ICD-10-CM | POA: Diagnosis not present

## 2022-01-15 DIAGNOSIS — E559 Vitamin D deficiency, unspecified: Secondary | ICD-10-CM

## 2022-01-15 DIAGNOSIS — E7800 Pure hypercholesterolemia, unspecified: Secondary | ICD-10-CM

## 2022-01-15 DIAGNOSIS — Z0001 Encounter for general adult medical examination with abnormal findings: Secondary | ICD-10-CM

## 2022-01-15 MED ORDER — ATORVASTATIN CALCIUM 80 MG PO TABS: 80.0000 mg | ORAL_TABLET | Freq: Every day | ORAL | 3 refills | Status: DC

## 2022-01-15 NOTE — Progress Notes (Signed)
Patient ID: [name], female   DOB: Apr 25, 1951, 71 y.o.   MRN: [id number]         Chief Complaint:: wellness exam and low vit d, hld, hyperglycemia       HPI:  [name] is a 71 y.o. female here for wellness exam; declines covid booster, o/w up to date                        Also taking more Vit D than she realized with multiple otc supplements, total over 2400 u per day  Pt denies chest pain, increased sob or doe, wheezing, orthopnea, PND, increased LE swelling, palpitations, dizziness or syncope.  Trying to follow lower chol diet.  Good compliance with meds.   Pt denies polydipsia, polyuria, or new focal neuro s/s.    Pt denies fever, wt loss, night sweats, loss of appetite, or other constitutional symptoms   Lost wt with more excercise Wt Readings from Last 3 Encounters:  01/15/22 183 lb (83 kg)  10/23/21 188 lb 12.8 oz (85.6 kg)  01/10/21 198 lb 6.4 oz (90 kg)   BP Readings from Last 3 Encounters:  01/15/22 118/76  10/23/21 122/74  01/10/21 126/77   Immunization History  Administered Date(s) Administered   Fluad Quad(high Dose 65+) 03/31/2019, 04/19/2020, 04/09/2021   Influenza Split 06/02/2011, 06/02/2012   Influenza, Seasonal, Injecte, Preservative Fre 06/02/2013   Influenza,inj,Quad PF,6+ Mos 05/19/2014, 04/27/2015, 04/24/2016, 05/01/2017, 05/11/2018   PFIZER(Purple Top)SARS-COV-2 Vaccination 09/09/2019, 10/04/2019, 05/10/2020, 11/10/2020   Pneumococcal Conjugate-13 12/30/2018   Pneumococcal Polysaccharide-23 01/10/2020   Td 03/13/2010   Tdap 01/10/2021   Zoster Recombinat (Shingrix) 05/16/2019, 08/23/2019   Zoster, Live 07/11/2014  There are no preventive care reminders to display for this patient.    Past Medical History:  Diagnosis Date   ALLERGIC RHINITIS 04/04/2007   Allergy    BURSITIS, RIGHT HIP 04/04/2007   COLONIC POLYPS, HX OF 04/04/2007   FATIGUE 05/12/2007   HYPERLIPIDEMIA 04/04/2007   LATERAL EPICONDYLITIS, RIGHT 03/13/2010   MASTITIS 08/11/2008    OSTEOARTHRITIS 04/04/2007   OSTEOARTHRITIS, KNEE, RIGHT 02/12/2009   OSTEOPENIA 03/13/2010   Palpitations 05/12/2007   stress/anxiety related   Unspecified vitamin D deficiency 02/12/2009   Past Surgical History:  Procedure Laterality Date   ABDOMINAL HYSTERECTOMY  2002   BLEPHAROPLASTY Bilateral 2015   COLONOSCOPY  09/29/2014   Carlean Purl- ssp polyp   COSMETIC SURGERY  2014   eye lift   DENTAL SURGERY  08/2019   perm implant   hemorrhoidectomoy  1988   kidney stone removal  2005   OOPHORECTOMY  2002    reports that she quit smoking about 47 years ago. Her smoking use included cigarettes. She has never used smokeless tobacco. She reports current alcohol use of about 3.0 - 4.0 standard drinks of alcohol per week. She reports that she does not use drugs. family history includes Gastric cancer in her paternal grandfather; Heart disease in her father and mother; Stomach cancer in her maternal grandfather. Allergies  Allergen Reactions   Sulfonamide Derivatives Swelling   Current Outpatient Medications on File Prior to Visit  Medication Sig Dispense Refill   Calcium Carb-Cholecalciferol (CALCIUM 500 + D3 PO) Take by mouth.     Cholecalciferol (VITAMIN D) 2000 units tablet Take 1 tablet (2,000 Units total) by mouth daily. 30 tablet 11   Magnesium Oxide 250 MG TABS Take 250 mg by mouth daily.     vitamin C (ASCORBIC ACID) 500  MG tablet Take 500 mg by mouth daily.     VITAMIN K PO Take 20 mcg by mouth daily.     No current facility-administered medications on file prior to visit.        ROS:  All others reviewed and negative.  Objective        PE:  BP 118/76 (BP Location: Right Arm, Patient Position: Sitting, Cuff Size: Large)   Pulse 72   Temp 98.1 F (36.7 C) (Oral)   Ht 5' 3.5" (1.613 m)   Wt 183 lb (83 kg)   LMP  (LMP Unknown)   SpO2 98%   BMI 31.91 kg/m                 Constitutional: Pt appears in NAD               HENT: Head: NCAT.                Right Ear: External  ear normal.                 Left Ear: External ear normal.                Eyes: . Pupils are equal, round, and reactive to light. Conjunctivae and EOM are normal               Nose: without d/c or deformity               Neck: Neck supple. Gross normal ROM               Cardiovascular: Normal rate and regular rhythm.                 Pulmonary/Chest: Effort normal and breath sounds without rales or wheezing.                Abd:  Soft, NT, ND, + BS, no organomegaly               Neurological: Pt is alert. At baseline orientation, motor grossly intact               Skin: Skin is warm. No rashes, no other new lesions, LE edema - none               Psychiatric: Pt behavior is normal without agitation   Micro: none  Cardiac tracings I have personally interpreted today:  none  Pertinent Radiological findings (summarize): none   Lab Results  Component Value Date   WBC 5.6 01/09/2022   HGB 14.1 01/09/2022   HCT 41.9 01/09/2022   PLT 195.0 01/09/2022   GLUCOSE 93 01/09/2022   CHOL 255 (H) 01/09/2022   TRIG 151.0 (H) 01/09/2022   HDL 58.60 01/09/2022   LDLDIRECT 162.0 03/06/2010   LDLCALC 166 (H) 01/09/2022   ALT 23 01/09/2022   AST 21 01/09/2022   NA 140 01/09/2022   K 4.3 01/09/2022   CL 104 01/09/2022   CREATININE 0.65 01/09/2022   BUN 15 01/09/2022   CO2 29 01/09/2022   TSH 2.77 01/09/2022   INR 1.0 01/18/2019   HGBA1C 5.6 01/09/2022   Assessment/Plan:  [name] is a 71 y.o. White or Caucasian [1] female with  has a past medical history of ALLERGIC RHINITIS (04/04/2007), Allergy, BURSITIS, RIGHT HIP (04/04/2007), COLONIC POLYPS, HX OF (04/04/2007), FATIGUE (05/12/2007), HYPERLIPIDEMIA (04/04/2007), LATERAL EPICONDYLITIS, RIGHT (03/13/2010), MASTITIS (08/11/2008), OSTEOARTHRITIS (04/04/2007), OSTEOARTHRITIS, KNEE, RIGHT (02/12/2009), OSTEOPENIA (03/13/2010), Palpitations (05/12/2007), and Unspecified vitamin  D deficiency (02/12/2009).  Encounter for well adult exam with abnormal  findings Age and sex appropriate education and counseling updated with regular exercise and diet Referrals for preventative services - none needed Immunizations addressed - decliens covid booster Smoking counseling  - none needed Evidence for depression or other mood disorder - none significant Most recent labs reviewed. I have personally reviewed and have noted: 1) the patient's medical and social history 2) The patient's current medications and supplements 3) The patient's height, weight, and BMI have been recorded in the chart   Vitamin D deficiency Last vitamin D Lab Results  Component Value Date   VD25OH 114.43 ([place]) 01/09/2022   overcontrolled, pt to cont oral replacement at lower 1400 u qd  Hyperlipidemia Lab Results  Component Value Date   LDLCALC 166 (H) 01/09/2022   Severe uncontrolled, to restart lipitor 80 mg qd, and recheck in 3 mo   Hyperglycemia Lab Results  Component Value Date   HGBA1C 5.6 01/09/2022   Stable, pt to continue current medical treatment  - diet  Followup: Return in about 1 year (around 01/16/2023).  Cathlean Cower, MD 01/18/2022 6:21 PM [facility]

## 2022-01-15 NOTE — Patient Instructions (Signed)
Ok to restart the lipitor 80 mg per day  Ok to cut back on the Vit D3 to 1400 units per day  Please continue all other medications as before, and refills have been done if requested.  Please have the pharmacy call with any other refills you may need.  Please continue your efforts at being more active, low cholesterol diet, and weight control.  You are otherwise up to date with prevention measures today.  Please keep your appointments with your specialists as you may have planned  Please go to the LAB at the blood drawing area for the tests to be done - the Vit D and Cholesterol in 3 months  Please make an Appointment to return for your 1 year visit, or sooner if needed, with Lab testing by Appointment as well, to be done about 3-5 days before at the [facility] (so this is for TWO appointments - please see the scheduling desk as you leave)   Due to the ongoing Covid 19 pandemic, our lab now requires an appointment for any labs done at our office.  If you need labs done and do not have an appointment, please call our office ahead of time to schedule before presenting to the lab for your testing.

## 2022-01-18 ENCOUNTER — Encounter: Payer: Self-pay | Admitting: Internal Medicine

## 2022-01-18 NOTE — Assessment & Plan Note (Signed)
Age and sex appropriate education and counseling updated with regular exercise and diet Referrals for preventative services - none needed Immunizations addressed - decliens covid booster Smoking counseling  - none needed Evidence for depression or other mood disorder - none significant Most recent labs reviewed. I have personally reviewed and have noted: 1) the patient's medical and social history 2) The patient's current medications and supplements 3) The patient's height, weight, and BMI have been recorded in the chart  

## 2022-01-18 NOTE — Assessment & Plan Note (Signed)
Lab Results  Component Value Date   HGBA1C 5.6 01/09/2022   Stable, pt to continue current medical treatment  - diet

## 2022-01-18 NOTE — Assessment & Plan Note (Signed)
Last vitamin D Lab Results  Component Value Date   VD25OH 114.43 ([place]) 01/09/2022   overcontrolled, pt to cont oral replacement at lower 1400 u qd

## 2022-01-18 NOTE — Assessment & Plan Note (Signed)
Lab Results  Component Value Date   LDLCALC 166 ([facility]) 01/09/2022   Severe uncontrolled, to restart lipitor 80 mg qd, and recheck in 3 mo

## 2022-04-09 ENCOUNTER — Ambulatory Visit: Payer: BC Managed Care – PPO

## 2022-04-30 ENCOUNTER — Ambulatory Visit (INDEPENDENT_AMBULATORY_CARE_PROVIDER_SITE_OTHER): Payer: BC Managed Care – PPO

## 2022-04-30 DIAGNOSIS — Z23 Encounter for immunization: Secondary | ICD-10-CM

## 2022-04-30 NOTE — Progress Notes (Signed)
After obtaining consent, and per orders of Dr. Jenny Reichmann, injection of High Flu shot given in the right deltoid by Marrian Salvage. Patient tolerated well and  instructed to report any adverse reaction to me immediately.

## 2022-05-01 ENCOUNTER — Encounter: Payer: Self-pay | Admitting: Internal Medicine

## 2022-05-09 ENCOUNTER — Other Ambulatory Visit (INDEPENDENT_AMBULATORY_CARE_PROVIDER_SITE_OTHER): Payer: BC Managed Care – PPO

## 2022-05-09 DIAGNOSIS — E785 Hyperlipidemia, unspecified: Secondary | ICD-10-CM | POA: Diagnosis not present

## 2022-05-09 DIAGNOSIS — E559 Vitamin D deficiency, unspecified: Secondary | ICD-10-CM

## 2022-05-09 LAB — LIPID PANEL
Cholesterol: 175 mg/dL (ref 0–200)
HDL: 54 mg/dL (ref >39.00)
LDL Cholesterol: 95 mg/dL (ref 0–99)
NonHDL: 121.27
Total CHOL/HDL Ratio: 3
Triglycerides: 130 mg/dL (ref 0.0–149.0)
VLDL: 26 mg/dL (ref 0.0–40.0)

## 2022-05-09 LAB — VITAMIN D 25 HYDROXY (VIT D DEFICIENCY, FRACTURES): VITD: 88.34 ng/mL (ref 30.00–100.00)

## 2022-06-05 DIAGNOSIS — H43811 Vitreous degeneration, right eye: Secondary | ICD-10-CM | POA: Diagnosis not present

## 2022-06-05 DIAGNOSIS — H2511 Age-related nuclear cataract, right eye: Secondary | ICD-10-CM | POA: Diagnosis not present

## 2022-07-07 DIAGNOSIS — H04123 Dry eye syndrome of bilateral lacrimal glands: Secondary | ICD-10-CM | POA: Diagnosis not present

## 2022-07-07 DIAGNOSIS — Q141 Congenital malformation of retina: Secondary | ICD-10-CM | POA: Diagnosis not present

## 2022-07-07 DIAGNOSIS — H43811 Vitreous degeneration, right eye: Secondary | ICD-10-CM | POA: Diagnosis not present

## 2022-07-07 DIAGNOSIS — H2513 Age-related nuclear cataract, bilateral: Secondary | ICD-10-CM | POA: Diagnosis not present

## 2022-07-14 ENCOUNTER — Other Ambulatory Visit: Payer: Self-pay | Admitting: Internal Medicine

## 2022-07-14 DIAGNOSIS — E559 Vitamin D deficiency, unspecified: Secondary | ICD-10-CM

## 2022-07-14 DIAGNOSIS — E785 Hyperlipidemia, unspecified: Secondary | ICD-10-CM

## 2022-07-14 DIAGNOSIS — E538 Deficiency of other specified B group vitamins: Secondary | ICD-10-CM

## 2022-07-14 DIAGNOSIS — R739 Hyperglycemia, unspecified: Secondary | ICD-10-CM

## 2022-07-16 ENCOUNTER — Other Ambulatory Visit: Payer: Self-pay | Admitting: Obstetrics and Gynecology

## 2022-07-16 DIAGNOSIS — L298 Other pruritus: Secondary | ICD-10-CM | POA: Diagnosis not present

## 2022-07-16 DIAGNOSIS — L820 Inflamed seborrheic keratosis: Secondary | ICD-10-CM | POA: Diagnosis not present

## 2022-07-16 DIAGNOSIS — L570 Actinic keratosis: Secondary | ICD-10-CM | POA: Diagnosis not present

## 2022-07-16 DIAGNOSIS — Z1231 Encounter for screening mammogram for malignant neoplasm of breast: Secondary | ICD-10-CM

## 2022-07-16 DIAGNOSIS — L538 Other specified erythematous conditions: Secondary | ICD-10-CM | POA: Diagnosis not present

## 2022-07-16 DIAGNOSIS — Z789 Other specified health status: Secondary | ICD-10-CM | POA: Diagnosis not present

## 2022-09-30 DIAGNOSIS — L821 Other seborrheic keratosis: Secondary | ICD-10-CM | POA: Diagnosis not present

## 2022-09-30 DIAGNOSIS — L74511 Primary focal hyperhidrosis, face: Secondary | ICD-10-CM | POA: Diagnosis not present

## 2022-09-30 DIAGNOSIS — D1801 Hemangioma of skin and subcutaneous tissue: Secondary | ICD-10-CM | POA: Diagnosis not present

## 2022-09-30 DIAGNOSIS — L814 Other melanin hyperpigmentation: Secondary | ICD-10-CM | POA: Diagnosis not present

## 2022-10-02 ENCOUNTER — Ambulatory Visit: Payer: BC Managed Care – PPO

## 2022-10-10 ENCOUNTER — Ambulatory Visit
Admission: RE | Admit: 2022-10-10 | Discharge: 2022-10-10 | Disposition: A | Discharge status: Home / Self Care | Payer: BC Managed Care – PPO | Source: Ambulatory Visit | Attending: Obstetrics and Gynecology | Admitting: Obstetrics and Gynecology

## 2022-10-10 DIAGNOSIS — Z1231 Encounter for screening mammogram for malignant neoplasm of breast: Secondary | ICD-10-CM | POA: Diagnosis not present

## 2022-10-21 NOTE — Progress Notes (Signed)
72 y.o. G0P0000 Married White or Caucasian Not Hispanic or Latino female here for Annual exam. H/O TAH/BSO. No vaginal bleeding. Not sexually active.   No bowel or bladder c/o.    No LMP recorded (lmp unknown). Patient has had a hysterectomy.          Sexually active: No.  The current method of family planning is status post hysterectomy.    Exercising: Yes.     Walking and pickle ball  Smoker:  no  Health Maintenance: Pap:  unsure  History of abnormal Pap:  no MMG:  10/10/22 density C Bi-rads 1 neg  BMD:   07/22/21 osteopenic  Colonoscopy: 3/26/5f/u 5 years  TDaP:  01/10/21  Gardasil: n/a   reports that she quit smoking about 48 years ago. Her smoking use included cigarettes. She has never used smokeless tobacco. She reports current alcohol use of about 3.0 - 4.0 standard drinks of alcohol per week. She reports that she does not use drugs. 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 No children.   Past Medical History:  Diagnosis Date   ALLERGIC RHINITIS 04/04/2007   Allergy    BURSITIS, RIGHT HIP 04/04/2007   COLONIC POLYPS, HX OF 04/04/2007   FATIGUE 05/12/2007   HYPERLIPIDEMIA 04/04/2007   LATERAL EPICONDYLITIS, RIGHT 03/13/2010   MASTITIS 08/11/2008   OSTEOARTHRITIS 04/04/2007   OSTEOARTHRITIS, KNEE, RIGHT 02/12/2009   OSTEOPENIA 03/13/2010   Palpitations 05/12/2007   stress/anxiety related   Unspecified vitamin D deficiency 02/12/2009    Past Surgical History:  Procedure Laterality Date   ABDOMINAL HYSTERECTOMY  2002   BLEPHAROPLASTY Bilateral 2015   COLONOSCOPY  09/29/2014   Carlean Purl- ssp polyp   COSMETIC SURGERY  2014   eye lift   DENTAL SURGERY  08/2019   perm implant   hemorrhoidectomoy  1988   kidney stone removal  2005   OOPHORECTOMY  2002    Current Outpatient Medications  Medication Sig Dispense Refill   atorvastatin (LIPITOR) 80 MG tablet Take 1 tablet (80 mg total) by mouth daily. 90 tablet 3   Calcium Carb-Cholecalciferol (CALCIUM 500 + D3 PO)  Take by mouth.     Cholecalciferol (VITAMIN D) 2000 units tablet Take 1 tablet (2,000 Units total) by mouth daily. 30 tablet 11   Magnesium Oxide 250 MG TABS Take 250 mg by mouth daily.     vitamin C (ASCORBIC ACID) 500 MG tablet Take 500 mg by mouth daily.     VITAMIN K PO Take 20 mcg by mouth daily.     No current facility-administered medications for this visit.    Family History  Problem Relation Age of Onset   Gastric cancer Paternal Grandfather    Heart disease Mother        died at 76yo   Heart disease Father        died at 62yo   Stomach cancer Maternal Grandfather    Colon cancer Neg Hx    Rectal cancer Neg Hx    Esophageal cancer Neg Hx     Review of Systems  All other systems reviewed and are negative.   Exam:   LMP  (LMP Unknown)   Weight change: @WEIGHTCHANGE @ Height:      Ht Readings from Last 3 Encounters:  01/15/22 5' 3.5" (1.613 m)  10/23/21 5' 3.5" (1.613 m)  01/10/21 5' 3.5" (1.613 m)    General appearance: alert, cooperative and appears stated age Head: Normocephalic, without obvious abnormality, atraumatic Neck: no adenopathy, supple, symmetrical, trachea midline and  thyroid normal to inspection and palpation Lungs: clear to auscultation bilaterally Cardiovascular: regular rate and rhythm Breasts: normal appearance, no masses or tenderness Abdomen: soft, non-tender; non distended,  no masses,  no organomegaly Extremities: extremities normal, atraumatic, no cyanosis or edema Skin: Skin color, texture, turgor normal. No rashes or lesions Lymph nodes: Cervical, supraclavicular, and axillary nodes normal. No abnormal inguinal nodes palpated Neurologic: Grossly normal   Pelvic: External genitalia:  no lesions              Urethra:  normal appearing urethra with no masses, tenderness or lesions              Bartholins and Skenes: normal                 Vagina: atrophic appearing vagina with normal color and discharge, no lesions              Cervix:  absent               Bimanual Exam:  Uterus:  uterus absent              Adnexa: no mass, fullness, tenderness               Rectovaginal: Confirms               Anus:  normal sphincter tone, no lesions  Gae Dry, CMA chaperoned for the exam.  1. Well woman exam Discussed breast self exam Discussed calcium and vit D intake Mammogram UTD Colonoscopy UTD Labs with primary No pap needed  2. Osteopenia, unspecified location Due in 12/24 - DG Bone Density; Future  3. Hypoestrogenism - DG Bone Density; Future

## 2022-10-29 ENCOUNTER — Encounter: Payer: Self-pay | Admitting: Obstetrics and Gynecology

## 2022-10-29 ENCOUNTER — Ambulatory Visit (INDEPENDENT_AMBULATORY_CARE_PROVIDER_SITE_OTHER): Payer: BC Managed Care – PPO | Admitting: Obstetrics and Gynecology

## 2022-10-29 VITALS — BP 122/74 | HR 68 | Ht 63.0 in | Wt 192.0 lb

## 2022-10-29 DIAGNOSIS — E2839 Other primary ovarian failure: Secondary | ICD-10-CM

## 2022-10-29 DIAGNOSIS — M8580 Other specified disorders of bone density and structure, unspecified site: Secondary | ICD-10-CM | POA: Diagnosis not present

## 2022-10-29 DIAGNOSIS — Z01419 Encounter for gynecological examination (general) (routine) without abnormal findings: Secondary | ICD-10-CM

## 2022-10-29 NOTE — Patient Instructions (Signed)

## 2023-01-08 ENCOUNTER — Encounter: Payer: Self-pay | Admitting: Internal Medicine

## 2023-01-08 ENCOUNTER — Ambulatory Visit: Payer: BC Managed Care – PPO | Admitting: Nurse Practitioner

## 2023-01-08 VITALS — BP 98/60 | HR 91 | Temp 98.2°F | Wt 196.4 lb

## 2023-01-08 DIAGNOSIS — R300 Dysuria: Secondary | ICD-10-CM | POA: Insufficient documentation

## 2023-01-08 LAB — BASIC METABOLIC PANEL
BUN: 13 mg/dL (ref 6–23)
CO2: 28 mEq/L (ref 19–32)
Calcium: 9.9 mg/dL (ref 8.4–10.5)
Chloride: 103 mEq/L (ref 96–112)
Creatinine, Ser: 0.72 mg/dL (ref 0.40–1.20)
GFR: 85.18 mL/min (ref >60.00)
Glucose, Bld: 109 mg/dL — ABNORMAL HIGH (ref 70–99)
Potassium: 4.5 mEq/L (ref 3.5–5.1)
Sodium: 139 mEq/L (ref 135–145)

## 2023-01-08 LAB — POCT URINALYSIS DIPSTICK
Bilirubin, UA: NEGATIVE
Blood, UA: POSITIVE
Glucose, UA: NEGATIVE
Ketones, UA: NEGATIVE
Nitrite, UA: NEGATIVE
Protein, UA: POSITIVE — AB
Spec Grav, UA: <=1.005 — AB (ref 1.010–1.025)
Urobilinogen, UA: 0.2 E.U./dL
pH, UA: 7.5 (ref 5.0–8.0)

## 2023-01-08 MED ORDER — PHENAZOPYRIDINE HCL 100 MG PO TABS: 100.0000 mg | ORAL_TABLET | Freq: Three times a day (TID) | ORAL | 0 refills | Status: DC | PRN

## 2023-01-08 MED ORDER — NITROFURANTOIN MONOHYD MACRO 100 MG PO CAPS: 100.0000 mg | ORAL_CAPSULE | Freq: Two times a day (BID) | ORAL | 0 refills | Status: AC

## 2023-01-08 NOTE — Assessment & Plan Note (Addendum)
Acute, symptom onset 1-2 days ago Has had gross hematuria intermittently, with intermittent left flank pain, no pain today. No fever, abdominal pain, nausea, diarrhea.  Point-of-care urinalysis positive for leukocytes and hematuria. Allergic to sulfas, will check urine culture and treat empirically today with nitrofurantoin.  I did discuss treatment options including nitrofurantoin and fosfomycin as first-line options for patient including their potential risks and benefits.  Per shared decision making patient will take nitrofurantoin. Will collect stat BMP to ensure stability of kidney function, will call patient if medication agent needs to be changed based on urine culture results and BMP results.  If no evidence of UTI or urine culture negative will evaluate hematuria further.

## 2023-01-08 NOTE — Progress Notes (Signed)
Established Patient Office Visit  Subjective   Patient ID: [name], female    DOB: Aug 10, 1950  Age: 72 y.o. MRN: [id number]  Chief Complaint  Patient presents with   Urinary Tract Infection    Burning sensation, light pink urine, scanty urination     Symptom onset 1 to 2 days ago Experiencing dysuria, intermittent hematuria, urgency, frequency.  Did report some vague left flank discomfort couple of days ago but this seems to have resolved.  No fever, no nausea, no abdominal pain.  No history of UTIs.  Does have a history of kidney stone approximately 20 years ago.  Reports pain is not nearly as severe as it was when she had a kidney stone 20 years ago.  Does admit to wearing tight athletic clothing when exercising at the gym which is infrequent but does last for about 3 hours.     Review of Systems  Constitutional:  Negative for fever.  Gastrointestinal:  Negative for abdominal pain, diarrhea, nausea and vomiting.  Genitourinary:  Positive for dysuria, flank pain (left side), frequency, hematuria and urgency.      Objective:     BP 98/60   Pulse 91   Temp 98.2 F (36.8 C) (Temporal)   Wt 196 lb 6 oz (89.1 kg)   LMP  (LMP Unknown)   SpO2 97%   BMI 34.79 kg/m  BP Readings from Last 3 Encounters:  01/08/23 98/60  10/29/22 122/74  01/15/22 118/76   Wt Readings from Last 3 Encounters:  01/08/23 196 lb 6 oz (89.1 kg)  10/29/22 192 lb (87.1 kg)  01/15/22 183 lb (83 kg)      Physical Exam Vitals reviewed.  Constitutional:      General: She is not in acute distress.    Appearance: Normal appearance.  HENT:     Head: Normocephalic and atraumatic.  Neck:     Vascular: No carotid bruit.  Cardiovascular:     Rate and Rhythm: Normal rate and regular rhythm.     Pulses: Normal pulses.     Heart sounds: Normal heart sounds.  Pulmonary:     Effort: Pulmonary effort is normal.     Breath sounds: Normal breath sounds.  Abdominal:     Tenderness: There is no  right CVA tenderness or left CVA tenderness.  Skin:    General: Skin is warm and dry.  Neurological:     General: No focal deficit present.     Mental Status: She is alert and oriented to person, place, and time.  Psychiatric:        Mood and Affect: Mood normal.        Behavior: Behavior normal.        Judgment: Judgment normal.      Results for orders placed or performed in visit on 01/08/23  POCT urinalysis dipstick  Result Value Ref Range   Color, UA     Clarity, UA     Glucose, UA Negative Negative   Bilirubin, UA negative    Ketones, UA negative    Spec Grav, UA <=1.005 (A) 1.010 - 1.025   Blood, UA positive    pH, UA 7.5 5.0 - 8.0   Protein, UA Positive (A) Negative   Urobilinogen, UA 0.2 0.2 or 1.0 E.U./dL   Nitrite, UA negative    Leukocytes, UA Large (3+) (A) Negative   Appearance     Odor        The 10-year ASCVD risk score (Arnett DK,  et al., 2019) is: 4.9%    Assessment & Plan:   Problem List Items Addressed This Visit       Other   Dysuria - Primary    Acute, symptom onset 1-2 days ago Has had gross hematuria intermittently, with intermittent left flank pain, no pain today. No fever, abdominal pain, nausea, diarrhea.  Point-of-care urinalysis positive for leukocytes and hematuria. Allergic to sulfas, will check urine culture and treat empirically today with nitrofurantoin.  I did discuss treatment options including nitrofurantoin and fosfomycin as first-line options for patient including their potential risks and benefits.  Per shared decision making patient will take nitrofurantoin. Will collect stat BMP to ensure stability of kidney function, will call patient if medication agent needs to be changed based on urine culture results and BMP results.  If no evidence of UTI or urine culture negative will evaluate hematuria further.        Relevant Medications   nitrofurantoin, macrocrystal-monohydrate, (MACROBID) 100 MG capsule   phenazopyridine  (PYRIDIUM) 100 MG tablet   Other Relevant Orders   Urine Culture   POCT urinalysis dipstick (Completed)   Basic metabolic panel    Return if symptoms worsen or fail to improve.    Elenore Paddy, NP

## 2023-01-10 LAB — URINE CULTURE

## 2023-01-15 ENCOUNTER — Other Ambulatory Visit (INDEPENDENT_AMBULATORY_CARE_PROVIDER_SITE_OTHER): Payer: BC Managed Care – PPO

## 2023-01-15 DIAGNOSIS — R739 Hyperglycemia, unspecified: Secondary | ICD-10-CM

## 2023-01-15 DIAGNOSIS — E785 Hyperlipidemia, unspecified: Secondary | ICD-10-CM

## 2023-01-15 DIAGNOSIS — E538 Deficiency of other specified B group vitamins: Secondary | ICD-10-CM | POA: Diagnosis not present

## 2023-01-15 DIAGNOSIS — E559 Vitamin D deficiency, unspecified: Secondary | ICD-10-CM

## 2023-01-15 LAB — HEPATIC FUNCTION PANEL
ALT: 27 U/L (ref 0–35)
AST: 25 U/L (ref 0–37)
Albumin: 3.8 g/dL (ref 3.5–5.2)
Alkaline Phosphatase: 105 U/L (ref 39–117)
Bilirubin, Direct: 0.2 mg/dL (ref 0.0–0.3)
Total Bilirubin: 0.8 mg/dL (ref 0.2–1.2)
Total Protein: 6.7 g/dL (ref 6.0–8.3)

## 2023-01-15 LAB — URINALYSIS, ROUTINE W REFLEX MICROSCOPIC
Bilirubin Urine: NEGATIVE
Hgb urine dipstick: NEGATIVE
Ketones, ur: NEGATIVE
Leukocytes,Ua: NEGATIVE
Nitrite: NEGATIVE
RBC / HPF: NONE SEEN (ref 0–?)
Specific Gravity, Urine: 1.025 (ref 1.000–1.030)
Total Protein, Urine: NEGATIVE
Urine Glucose: NEGATIVE
Urobilinogen, UA: 0.2 (ref 0.0–1.0)
pH: 6 (ref 5.0–8.0)

## 2023-01-15 LAB — CBC WITH DIFFERENTIAL/PLATELET
Basophils Absolute: 0 10*3/uL (ref 0.0–0.1)
Basophils Relative: 0.6 % (ref 0.0–3.0)
Eosinophils Absolute: 0.1 10*3/uL (ref 0.0–0.7)
Eosinophils Relative: 1.8 % (ref 0.0–5.0)
HCT: 40.6 % (ref 36.0–46.0)
Hemoglobin: 13.3 g/dL (ref 12.0–15.0)
Lymphocytes Relative: 16.8 % (ref 12.0–46.0)
Lymphs Abs: 1 10*3/uL (ref 0.7–4.0)
MCHC: 32.8 g/dL (ref 30.0–36.0)
MCV: 87.3 fl (ref 78.0–100.0)
Monocytes Absolute: 0.5 10*3/uL (ref 0.1–1.0)
Monocytes Relative: 9.2 % (ref 3.0–12.0)
Neutro Abs: 4 10*3/uL (ref 1.4–7.7)
Neutrophils Relative %: 71.6 % (ref 43.0–77.0)
Platelets: 207 10*3/uL (ref 150.0–400.0)
RBC: 4.65 Mil/uL (ref 3.87–5.11)
RDW: 13.4 % (ref 11.5–15.5)
WBC: 5.7 10*3/uL (ref 4.0–10.5)

## 2023-01-15 LAB — LIPID PANEL
Cholesterol: 159 mg/dL (ref 0–200)
HDL: 45.3 mg/dL (ref >39.00)
LDL Cholesterol: 89 mg/dL (ref 0–99)
NonHDL: 113.23
Total CHOL/HDL Ratio: 3
Triglycerides: 121 mg/dL (ref 0.0–149.0)
VLDL: 24.2 mg/dL (ref 0.0–40.0)

## 2023-01-15 LAB — VITAMIN D 25 HYDROXY (VIT D DEFICIENCY, FRACTURES): VITD: 73.81 ng/mL (ref 30.00–100.00)

## 2023-01-15 LAB — MICROALBUMIN / CREATININE URINE RATIO
Creatinine,U: 162.6 mg/dL
Microalb Creat Ratio: 0.4 mg/g (ref 0.0–30.0)
Microalb, Ur: <0.7 mg/dL (ref 0.0–1.9)

## 2023-01-15 LAB — TSH: TSH: 2.81 u[IU]/mL (ref 0.35–5.50)

## 2023-01-15 LAB — VITAMIN B12: Vitamin B-12: 397 pg/mL (ref 211–911)

## 2023-01-15 LAB — HEMOGLOBIN A1C: Hgb A1c MFr Bld: 5.5 % (ref 4.6–6.5)

## 2023-01-16 ENCOUNTER — Encounter: Payer: Self-pay | Admitting: Nurse Practitioner

## 2023-01-20 ENCOUNTER — Ambulatory Visit (INDEPENDENT_AMBULATORY_CARE_PROVIDER_SITE_OTHER): Payer: BC Managed Care – PPO | Admitting: Internal Medicine

## 2023-01-20 ENCOUNTER — Other Ambulatory Visit (INDEPENDENT_AMBULATORY_CARE_PROVIDER_SITE_OTHER): Payer: BC Managed Care – PPO

## 2023-01-20 ENCOUNTER — Encounter: Payer: Self-pay | Admitting: Internal Medicine

## 2023-01-20 VITALS — BP 124/76 | HR 65 | Temp 98.4°F | Ht 63.0 in | Wt 196.0 lb

## 2023-01-20 DIAGNOSIS — E785 Hyperlipidemia, unspecified: Secondary | ICD-10-CM | POA: Diagnosis not present

## 2023-01-20 DIAGNOSIS — R739 Hyperglycemia, unspecified: Secondary | ICD-10-CM

## 2023-01-20 DIAGNOSIS — R300 Dysuria: Secondary | ICD-10-CM | POA: Diagnosis not present

## 2023-01-20 DIAGNOSIS — Z0000 Encounter for general adult medical examination without abnormal findings: Secondary | ICD-10-CM

## 2023-01-20 DIAGNOSIS — E559 Vitamin D deficiency, unspecified: Secondary | ICD-10-CM | POA: Diagnosis not present

## 2023-01-20 DIAGNOSIS — Z0001 Encounter for general adult medical examination with abnormal findings: Secondary | ICD-10-CM

## 2023-01-20 DIAGNOSIS — E538 Deficiency of other specified B group vitamins: Secondary | ICD-10-CM

## 2023-01-20 LAB — URINALYSIS, ROUTINE W REFLEX MICROSCOPIC
Bilirubin Urine: NEGATIVE
Hgb urine dipstick: NEGATIVE
Leukocytes,Ua: NEGATIVE
Nitrite: NEGATIVE
RBC / HPF: NONE SEEN (ref 0–?)
Specific Gravity, Urine: 1.02 (ref 1.000–1.030)
Total Protein, Urine: NEGATIVE
Urine Glucose: NEGATIVE
Urobilinogen, UA: 0.2 (ref 0.0–1.0)
pH: 7 (ref 5.0–8.0)

## 2023-01-20 MED ORDER — ATORVASTATIN CALCIUM 80 MG PO TABS: 80.0000 mg | ORAL_TABLET | Freq: Every day | ORAL | 3 refills | Status: DC

## 2023-01-20 NOTE — Progress Notes (Signed)
Patient ID: [id number] OMLOR, female   DOB: 1951/02/20, 72 y.o.   MRN: [id number]         Chief Complaint:: wellness exam and dysuria,  low vit d, hld, hyperglycemia,        HPI:  [name] is a 72 y.o. female here for wellness exam; declines covid booster, o/w up to date                        Also no personal hx of covid infection.  Did have uti last wk, f/u UA ok.  Still some mild intermittent dysurai, asks for repeat ua. Pt denies chest pain, increased sob or doe, wheezing, orthopnea, PND, increased LE swelling, palpitations, dizziness or syncope.   Pt denies polydipsia, polyuria, or new focal neuro s/s.    Pt denies fever, wt loss, night sweats, loss of appetite, or other constitutional symptoms     Wt Readings from Last 3 Encounters:  01/20/23 196 lb (88.9 kg)  01/08/23 196 lb 6 oz (89.1 kg)  10/29/22 192 lb (87.1 kg)   BP Readings from Last 3 Encounters:  01/20/23 124/76  01/08/23 98/60  10/29/22 122/74   Immunization History  Administered Date(s) Administered   Fluad Quad(high Dose 65+) 03/31/2019, 04/19/2020, 04/09/2021, 04/30/2022   Influenza Split 06/02/2011, 06/02/2012   Influenza, Seasonal, Injecte, Preservative Fre 06/02/2013   Influenza,inj,Quad PF,6+ Mos 05/19/2014, 04/27/2015, 04/24/2016, 05/01/2017, 05/11/2018   PFIZER(Purple Top)SARS-COV-2 Vaccination 09/09/2019, 10/04/2019, 05/10/2020, 11/10/2020   Pneumococcal Conjugate-13 12/30/2018   Pneumococcal Polysaccharide-23 01/10/2020   Td 03/13/2010   Tdap 01/10/2021   Zoster Recombinat (Shingrix) 05/16/2019, 08/23/2019   Zoster, Live 07/11/2014   There are no preventive care reminders to display for this patient.     Past Medical History:  Diagnosis Date   ALLERGIC RHINITIS 04/04/2007   Allergy    BURSITIS, RIGHT HIP 04/04/2007   COLONIC POLYPS, HX OF 04/04/2007   FATIGUE 05/12/2007   HYPERLIPIDEMIA 04/04/2007   LATERAL EPICONDYLITIS, RIGHT 03/13/2010   MASTITIS 08/11/2008   OSTEOARTHRITIS 04/04/2007    OSTEOARTHRITIS, KNEE, RIGHT 02/12/2009   OSTEOPENIA 03/13/2010   Palpitations 05/12/2007   stress/anxiety related   Unspecified vitamin D deficiency 02/12/2009   Past Surgical History:  Procedure Laterality Date   ABDOMINAL HYSTERECTOMY  2002   BLEPHAROPLASTY Bilateral 2015   COLONOSCOPY  09/29/2014   Leone Payor- ssp polyp   COSMETIC SURGERY  2014   eye lift   DENTAL SURGERY  08/2019   perm implant   hemorrhoidectomoy  1988   kidney stone removal  2005   OOPHORECTOMY  2002    reports that she quit smoking about 48 years ago. Her smoking use included cigarettes. She has never used smokeless tobacco. She reports current alcohol use of about 3.0 - 4.0 standard drinks of alcohol per week. She reports that she does not use drugs. family history includes Gastric cancer in her paternal grandfather; Heart disease in her father and mother; Stomach cancer in her maternal grandfather. Allergies  Allergen Reactions   Sulfonamide Derivatives Swelling   Current Outpatient Medications on File Prior to Visit  Medication Sig Dispense Refill   Calcium Carb-Cholecalciferol (CALCIUM 500 + D3 PO) Take by mouth.     Cholecalciferol (VITAMIN D) 2000 units tablet Take 1 tablet (2,000 Units total) by mouth daily. 30 tablet 11   glycopyrrolate (ROBINUL) 1 MG tablet Take 1 mg by mouth daily. And increase as needed     Magnesium Oxide 250 MG TABS  Take 250 mg by mouth daily.     phenazopyridine (PYRIDIUM) 100 MG tablet Take 1 tablet (100 mg total) by mouth 3 (three) times daily as needed for pain. 10 tablet 0   vitamin C (ASCORBIC ACID) 500 MG tablet Take 500 mg by mouth daily.     VITAMIN K PO Take 20 mcg by mouth daily.     No current facility-administered medications on file prior to visit.        ROS:  All others reviewed and negative.  Objective        PE:  BP 124/76 (BP Location: Left Arm, Patient Position: Sitting, Cuff Size: Normal)   Pulse 65   Temp 98.4 F (36.9 C) (Oral)   Ht 5\' 3"  (1.6 m)    Wt 196 lb (88.9 kg)   LMP  (LMP Unknown)   SpO2 98%   BMI 34.72 kg/m                 Constitutional: Pt appears in NAD               HENT: Head: NCAT.                Right Ear: External ear normal.                 Left Ear: External ear normal.                Eyes: . Pupils are equal, round, and reactive to light. Conjunctivae and EOM are normal               Nose: without d/c or deformity               Neck: Neck supple. Gross normal ROM               Cardiovascular: Normal rate and regular rhythm.                 Pulmonary/Chest: Effort normal and breath sounds without rales or wheezing.                Abd:  Soft, NT, ND, + BS, no organomegaly               Neurological: Pt is alert. At baseline orientation, motor grossly intact               Skin: Skin is warm. No rashes, no other new lesions, LE edema - none               Psychiatric: Pt behavior is normal without agitation   Micro: none  Cardiac tracings I have personally interpreted today:  none  Pertinent Radiological findings (summarize): none   Lab Results  Component Value Date   WBC 5.7 01/15/2023   HGB 13.3 01/15/2023   HCT 40.6 01/15/2023   PLT 207.0 01/15/2023   GLUCOSE 109 (H) 01/08/2023   CHOL 159 01/15/2023   TRIG 121.0 01/15/2023   HDL 45.30 01/15/2023   LDLDIRECT 162.0 03/06/2010   LDLCALC 89 01/15/2023   ALT 27 01/15/2023   AST 25 01/15/2023   NA 139 01/08/2023   K 4.5 01/08/2023   CL 103 01/08/2023   CREATININE 0.72 01/08/2023   BUN 13 01/08/2023   CO2 28 01/08/2023   TSH 2.81 01/15/2023   INR 1.0 01/18/2019   HGBA1C 5.5 01/15/2023   MICROALBUR <0.7 01/15/2023   Assessment/Plan:  [name] is a 72 y.o. White or Caucasian [1] female  with  has a past medical history of ALLERGIC RHINITIS (04/04/2007), Allergy, BURSITIS, RIGHT HIP (04/04/2007), COLONIC POLYPS, HX OF (04/04/2007), FATIGUE (05/12/2007), HYPERLIPIDEMIA (04/04/2007), LATERAL EPICONDYLITIS, RIGHT (03/13/2010), MASTITIS (08/11/2008),  OSTEOARTHRITIS (04/04/2007), OSTEOARTHRITIS, KNEE, RIGHT (02/12/2009), OSTEOPENIA (03/13/2010), Palpitations (05/12/2007), and Unspecified vitamin D deficiency (02/12/2009).  Encounter for well adult exam with abnormal findings Age and sex appropriate education and counseling updated with regular exercise and diet Referrals for preventative services - none needed Immunizations addressed - declines covid booster Smoking counseling  - none needed Evidence for depression or other mood disorder - none significant Most recent labs reviewed. I have personally reviewed and have noted: 1) the patient's medical and social history 2) The patient's current medications and supplements 3) The patient's height, weight, and BMI have been recorded in the chart   Vitamin D deficiency Last vitamin D Lab Results  Component Value Date   VD25OH 73.81 01/15/2023   Stable, cont oral replacement   Hyperlipidemia Lab Results  Component Value Date   LDLCALC 89 01/15/2023   Stable, pt to continue current statin lipitor 80 mg qd   Hyperglycemia Lab Results  Component Value Date   HGBA1C 5.5 01/15/2023   Stable, pt to continue current medical treatment  - diet, wt control   Dysuria S/p recent uti tx, now with mild recurrent symptoms, for repeat UA culture  Followup: Return in about 1 year (around 01/20/2024).  Oliver Barre, MD 01/24/2023 3:19 PM [facility]

## 2023-01-20 NOTE — Patient Instructions (Signed)
Please continue all other medications as before, and refills have been done if requested.  Please have the pharmacy call with any other refills you may need.  Please continue your efforts at being more active, low cholesterol diet, and weight control.  You are otherwise up to date with prevention measures today.  Please keep your appointments with your specialists as you may have planned  Please go to the LAB at the blood drawing area for the tests to be done - just the urine testing today  You will be contacted by phone if any changes need to be made immediately.  Otherwise, you will receive a letter about your results with an explanation, but please check with MyChart first.  Please remember to sign up for MyChart if you have not done so, as this will be important to you in the future with finding out test results, communicating by private email, and scheduling acute appointments online when needed.  Please make an Appointment to return for your 1 year visit, or sooner if needed, with Lab testing by Appointment as well, to be done about 3-5 days before at the FIRST FLOOR Lab (so this is for TWO appointments - please see the scheduling desk as you leave)

## 2023-01-22 ENCOUNTER — Other Ambulatory Visit: Payer: Self-pay | Admitting: Internal Medicine

## 2023-01-22 LAB — URINE CULTURE
MICRO NUMBER:: 15096468
SPECIMEN QUALITY:: ADEQUATE

## 2023-01-22 MED ORDER — CEPHALEXIN 500 MG PO CAPS
500.0000 mg | ORAL_CAPSULE | Freq: Three times a day (TID) | ORAL | 0 refills | Status: DC
Start: 2023-01-22 — End: 2023-12-23

## 2023-01-24 ENCOUNTER — Encounter: Payer: Self-pay | Admitting: Internal Medicine

## 2023-01-24 NOTE — Assessment & Plan Note (Signed)
S/p recent uti tx, now with mild recurrent symptoms, for repeat UA culture

## 2023-01-24 NOTE — Assessment & Plan Note (Signed)
Lab Results  Component Value Date   HGBA1C 5.5 01/15/2023   Stable, pt to continue current medical treatment  - diet, wt control

## 2023-01-24 NOTE — Addendum Note (Signed)
Addended by: Corwin Levins on: 01/24/2023 03:21 PM   Modules accepted: Orders

## 2023-01-24 NOTE — Assessment & Plan Note (Signed)
Last vitamin D Lab Results  Component Value Date   VD25OH 73.81 01/15/2023   Stable, cont oral replacement

## 2023-01-24 NOTE — Assessment & Plan Note (Signed)

## 2023-01-24 NOTE — Assessment & Plan Note (Signed)
Lab Results  Component Value Date   LDLCALC 89 01/15/2023   Stable, pt to continue current statin lipitor 80 mg qd

## 2023-02-03 ENCOUNTER — Encounter: Payer: Self-pay | Admitting: Internal Medicine

## 2023-03-03 ENCOUNTER — Encounter: Payer: Self-pay | Admitting: Internal Medicine

## 2023-04-24 ENCOUNTER — Ambulatory Visit: Payer: BC Managed Care – PPO

## 2023-04-28 ENCOUNTER — Ambulatory Visit (INDEPENDENT_AMBULATORY_CARE_PROVIDER_SITE_OTHER): Payer: BC Managed Care – PPO

## 2023-04-28 DIAGNOSIS — Z23 Encounter for immunization: Secondary | ICD-10-CM | POA: Diagnosis not present

## 2023-04-28 NOTE — Progress Notes (Signed)
Patient presented in office for her HD flu shot. HD flu vaccine was administered into her left deltoid muscle. She tolerated the injection well and the injection site looked fine. Patient was advised to report to the office if she noticed any adverse reaction.

## 2023-05-07 ENCOUNTER — Other Ambulatory Visit: Payer: Self-pay | Admitting: Internal Medicine

## 2023-05-07 DIAGNOSIS — Z1231 Encounter for screening mammogram for malignant neoplasm of breast: Secondary | ICD-10-CM

## 2023-07-08 DIAGNOSIS — H04123 Dry eye syndrome of bilateral lacrimal glands: Secondary | ICD-10-CM | POA: Diagnosis not present

## 2023-07-08 DIAGNOSIS — H2513 Age-related nuclear cataract, bilateral: Secondary | ICD-10-CM | POA: Diagnosis not present

## 2023-07-08 DIAGNOSIS — Q141 Congenital malformation of retina: Secondary | ICD-10-CM | POA: Diagnosis not present

## 2023-07-24 ENCOUNTER — Ambulatory Visit
Admission: RE | Admit: 2023-07-24 | Discharge: 2023-07-24 | Disposition: A | Discharge status: Home / Self Care | Payer: BC Managed Care – PPO | Source: Ambulatory Visit | Attending: Obstetrics and Gynecology | Admitting: Obstetrics and Gynecology

## 2023-07-24 ENCOUNTER — Telehealth: Payer: Self-pay | Admitting: Internal Medicine

## 2023-07-24 DIAGNOSIS — M8580 Other specified disorders of bone density and structure, unspecified site: Secondary | ICD-10-CM

## 2023-07-24 DIAGNOSIS — M8588 Other specified disorders of bone density and structure, other site: Secondary | ICD-10-CM | POA: Diagnosis not present

## 2023-07-24 DIAGNOSIS — Z90722 Acquired absence of ovaries, bilateral: Secondary | ICD-10-CM | POA: Diagnosis not present

## 2023-07-24 DIAGNOSIS — N958 Other specified menopausal and perimenopausal disorders: Secondary | ICD-10-CM | POA: Diagnosis not present

## 2023-07-24 DIAGNOSIS — E2839 Other primary ovarian failure: Secondary | ICD-10-CM

## 2023-07-24 NOTE — Telephone Encounter (Signed)
Patient would like a call back regarding her test results regarding her bone density test patient is not able to log into mychart

## 2023-07-27 NOTE — Telephone Encounter (Signed)
Patient did review her results through my chat via and see was able to see the results for this but no one was has reached out to patient and there are concerns from patient

## 2023-07-28 NOTE — Telephone Encounter (Signed)
Called and left voicemail letting Pt know that was ordered by a different provider and she may call there office with concerns about her results.

## 2023-08-26 DIAGNOSIS — M7062 Trochanteric bursitis, left hip: Secondary | ICD-10-CM | POA: Diagnosis not present

## 2023-08-26 DIAGNOSIS — M545 Low back pain, unspecified: Secondary | ICD-10-CM | POA: Diagnosis not present

## 2023-08-28 DIAGNOSIS — M6281 Muscle weakness (generalized): Secondary | ICD-10-CM | POA: Diagnosis not present

## 2023-08-28 DIAGNOSIS — M7062 Trochanteric bursitis, left hip: Secondary | ICD-10-CM | POA: Diagnosis not present

## 2023-08-31 DIAGNOSIS — M6281 Muscle weakness (generalized): Secondary | ICD-10-CM | POA: Diagnosis not present

## 2023-08-31 DIAGNOSIS — M7062 Trochanteric bursitis, left hip: Secondary | ICD-10-CM | POA: Diagnosis not present

## 2023-09-07 DIAGNOSIS — M7062 Trochanteric bursitis, left hip: Secondary | ICD-10-CM | POA: Diagnosis not present

## 2023-09-07 DIAGNOSIS — M6281 Muscle weakness (generalized): Secondary | ICD-10-CM | POA: Diagnosis not present

## 2023-09-10 DIAGNOSIS — K080 Exfoliation of teeth due to systemic causes: Secondary | ICD-10-CM | POA: Diagnosis not present

## 2023-09-11 DIAGNOSIS — M7062 Trochanteric bursitis, left hip: Secondary | ICD-10-CM | POA: Diagnosis not present

## 2023-09-11 DIAGNOSIS — M6281 Muscle weakness (generalized): Secondary | ICD-10-CM | POA: Diagnosis not present

## 2023-09-18 DIAGNOSIS — M7062 Trochanteric bursitis, left hip: Secondary | ICD-10-CM | POA: Diagnosis not present

## 2023-09-18 DIAGNOSIS — M6281 Muscle weakness (generalized): Secondary | ICD-10-CM | POA: Diagnosis not present

## 2023-09-22 DIAGNOSIS — K080 Exfoliation of teeth due to systemic causes: Secondary | ICD-10-CM | POA: Diagnosis not present

## 2023-09-28 DIAGNOSIS — M7062 Trochanteric bursitis, left hip: Secondary | ICD-10-CM | POA: Diagnosis not present

## 2023-09-28 DIAGNOSIS — M6281 Muscle weakness (generalized): Secondary | ICD-10-CM | POA: Diagnosis not present

## 2023-09-30 DIAGNOSIS — M7062 Trochanteric bursitis, left hip: Secondary | ICD-10-CM | POA: Diagnosis not present

## 2023-09-30 DIAGNOSIS — M545 Low back pain, unspecified: Secondary | ICD-10-CM | POA: Diagnosis not present

## 2023-10-12 ENCOUNTER — Ambulatory Visit
Admission: RE | Admit: 2023-10-12 | Discharge: 2023-10-12 | Disposition: A | Discharge status: Home / Self Care | Payer: BC Managed Care – PPO | Source: Ambulatory Visit | Attending: Internal Medicine | Admitting: Internal Medicine

## 2023-10-12 DIAGNOSIS — Z1231 Encounter for screening mammogram for malignant neoplasm of breast: Secondary | ICD-10-CM

## 2023-10-15 ENCOUNTER — Encounter: Payer: Self-pay | Admitting: Obstetrics and Gynecology

## 2023-12-23 ENCOUNTER — Encounter: Payer: Medicare Other | Admitting: Obstetrics and Gynecology

## 2023-12-23 ENCOUNTER — Ambulatory Visit (INDEPENDENT_AMBULATORY_CARE_PROVIDER_SITE_OTHER): Admitting: Obstetrics and Gynecology

## 2023-12-23 ENCOUNTER — Encounter: Payer: Self-pay | Admitting: Obstetrics and Gynecology

## 2023-12-23 VITALS — BP 124/80 | HR 82 | Ht 63.0 in | Wt 205.0 lb

## 2023-12-23 DIAGNOSIS — N952 Postmenopausal atrophic vaginitis: Secondary | ICD-10-CM | POA: Diagnosis not present

## 2023-12-23 DIAGNOSIS — M8589 Other specified disorders of bone density and structure, multiple sites: Secondary | ICD-10-CM

## 2023-12-23 DIAGNOSIS — Z87440 Personal history of urinary (tract) infections: Secondary | ICD-10-CM

## 2023-12-23 DIAGNOSIS — Z01419 Encounter for gynecological examination (general) (routine) without abnormal findings: Secondary | ICD-10-CM | POA: Diagnosis not present

## 2023-12-23 MED ORDER — ESTRADIOL 0.1 MG/GM VA CREA: TOPICAL_CREAM | VAGINAL | 2 refills | Status: AC

## 2023-12-23 NOTE — Patient Instructions (Addendum)
 Estradiol Vaginal Cream What is this medication? ESTRADIOL (es tra DYE ole) reduces vaginal irritation, dryness, and pain during sex due to menopause. It is an estrogen hormone. This medicine may be used for other purposes; ask your health care provider or pharmacist if you have questions. COMMON BRAND NAME(S): Estrace What should I tell my care team before I take this medication? They need to know if you have any of these conditions: Abnormal vaginal bleeding Blood vessel disease or blood clots Breast, cervical, endometrial, ovarian, liver, or uterine cancer Dementia Diabetes Gallbladder disease Heart disease or recent heart attack High blood pressure High cholesterol High levels of calcium  in the blood Hysterectomy Kidney disease Liver disease Migraine headaches Protein C/S deficiency Stroke Systemic lupus erythematosus (SLE) Tobacco use An unusual or allergic reaction to estrogens, soy, other medications, foods, dyes, or preservatives Pregnant or trying to get pregnant Breast-feeding How should I use this medication? This medication is for use in the vagina only. Do not take by mouth. Follow the directions on the prescription label. Read package directions carefully before using. Use the special applicator supplied with the cream. Wash hands before and after use. Fill the applicator with the prescribed amount of cream. Lie on your back, part and bend your knees. Insert the applicator into the vagina and push the plunger to expel the cream into the vagina. Wash the applicator with warm soapy water and rinse well. Use exactly as directed for the complete length of time prescribed. Do not stop using except on the advice of your care team. A patient package insert for the product will be given with each prescription and refill. Read this sheet carefully each time. The sheet may change frequently. Talk to your care team about the use of this medication in children. This medication is not  approved for use in children. Overdosage: If you think you have taken too much of this medicine contact a poison control center or emergency room at once. NOTE: This medicine is only for you. Do not share this medicine with others. What if I miss a dose? If you miss a dose, use it as soon as you can. If it is almost time for your next dose, use only that dose. Do not use double or extra doses. What may interact with this medication? Do not take this medication with any of the following: Aromatase inhibitors like aminoglutethimide, anastrozole, exemestane, letrozole, testolactone This medication may also interact with the following: Barbiturates used for inducing sleep or treating seizures Carbamazepine Grapefruit juice Medications for fungal infections like ketoconazole and itraconazole Raloxifene Rifabutin Rifampin Rifapentine Ritonavir Some antibiotics used to treat infections St. John's Wort Tamoxifen Warfarin This list may not describe all possible interactions. Give your health care provider a list of all the medicines, herbs, non-prescription drugs, or dietary supplements you use. Also tell them if you smoke, drink alcohol, or use illegal drugs. Some items may interact with your medicine. What should I watch for while using this medication? Visit your care team for regular checks on your progress. You will need a regular breast and pelvic exam. You should also discuss the need for regular mammograms with your care team, and follow their guidelines. This medication can make your body retain fluid, making your fingers, hands, or ankles swell. Your blood pressure can go up. Contact your care team if you feel you are retaining fluid. If you have any reason to think you are pregnant, stop taking this medication at once and contact your care team.  Smoking tobacco increases the risk of getting a blood clot or having a stroke while you are taking this medication, especially if you are older  than 35 years. If you wear contact lenses and notice visual changes, or if the lenses begin to feel uncomfortable, consult your eye care specialist. If you are going to have elective surgery, you may need to stop taking this medication beforehand. Consult your care team for advice prior to scheduling the surgery. What side effects may I notice from receiving this medication? Side effects that you should report to your care team as soon as possible: Allergic reactions--skin rash, itching, hives, swelling of the face, lips, tongue, or throat Blood clot--pain, swelling, or warmth in the leg, shortness of breath, chest pain Breast tissue changes, new lumps, redness, pain, or discharge from the nipple Gallbladder problems--severe stomach pain, nausea, vomiting, fever Increase in blood pressure Liver injury--right upper belly pain, loss of appetite, nausea, light-colored stool, dark yellow or brown urine, yellowing skin or eyes, unusual weakness or fatigue Stroke--sudden numbness or weakness of the face, arm, or leg, trouble speaking, confusion, trouble walking, loss of balance or coordination, dizziness, severe headache, change in vision Unusual vaginal discharge, itching, or odor Vaginal bleeding after menopause, pelvic pain Side effects that usually do not require medical attention (report to your care team if they continue or are bothersome): Bloating Breast pain or tenderness Nausea Vaginal irritation at application site Vomiting This list may not describe all possible side effects. Call your doctor for medical advice about side effects. You may report side effects to FDA at 1-800-FDA-1088. Where should I keep my medication? Keep out of the reach of children and pets. Store at room temperature between 15 and 30 degrees C (59 and 86 degrees F). Protect from temperatures above 40 degrees C (104 degrees C). Do not freeze. Throw away any unused medication after the expiration date. NOTE: This  sheet is a summary. It may not cover all possible information. If you have questions about this medicine, talk to your doctor, pharmacist, or health care provider.  2024 Elsevier/Gold Standard (2021-03-29 00:00:00)  Vaginal Dryness and Thinning (Atrophic Vaginitis): What to Know  Atrophic vaginitis is a condition where the lining of the vagina becomes thin, dry, and inflamed. This condition can make you more likely to: Get an infection. Have pain during sex. Have other uncomfortable symptoms. Tell your health care provider if you notice any changes in your vagina. They can help you find ways to feel better. They can also treat infections and suggest healthy habits for a healthy vagina. What are the causes? Atrophic vaginitis is caused by a drop in estrogen. It's more common when menstrual periods stop during menopause. This often starts between ages 17-55 and may get worse over time as estrogen levels get lower. Estrogen helps to: Keep the vagina moist. Lubricate the vagina during sex. Protect against infection. A drop in estrogen can lead to: Thinner and drier vaginal lining. A smaller, less stretchy vagina. What increases the risk? Many factors make you more likely to have vaginal dryness and thinning. These include: Taking medicines that block estrogen. Having your ovaries taken out. Cancer treatment, such as: Radiation. Chemotherapy. Having recently delivered a baby, especially if not a vaginal delivery. Breastfeeding. Being over age 80. Having an eating disorder. Smoking. What are the signs or symptoms? Pain, soreness, or bleeding during sex. Burning, irritation, or itching around your vagina. Loss of interest in sex. Burning pain while peeing or peeing often. Vaginal  discharge. It may be: White. Martina Sledge. Yellow. Blood-tinged. Thick. Watery. Sometimes, there are no symptoms. How is this diagnosed? This condition is diagnosed based on: Your medical history. A pelvic  exam to check the tissue in the vagina. Rarely, you may have more tests. These include: A pee test to check for a urinary tract infection. An acid balance check in the vagina. How is this treated? Treatment depends on your symptoms. Treatment may include: Lubricants for the vagina. Long-acting moisturizers. Low-dose estrogen. These come in: Creams. Tablets. Vaginal rings. Treatment may not be needed if your symptoms are mild and you're not having sex. Talk to your provider about any history of breast cancer, endometrial cancer, or blood clots. Follow these instructions at home: Medicines Take your medicines only as told. Do not use herbal or other medicines unless your provider says it's safe. Use creams, lubricants, or moisturizers only as told. General instructions If your dryness and thinning is related to menopause, talk about your symptoms and treatment options with your provider. Do not douche. Do not use products that make your vagina dry. These include: Scented sprays. Tampons. Soaps. Use water-soluble lubricant or moisturizer if sex is painful. Having sex more often can improve blood flow and make the vaginal tissue more stretchy. Contact a health care provider if: Your discharge from your vagina changes in color and has a smell. You have new symptoms. Your symptoms don't get better with treatment. Your symptoms get worse. This information is not intended to replace advice given to you by your health care provider. Make sure you discuss any questions you have with your health care provider. Document Revised: 03/02/2023 Document Reviewed: 03/02/2023 Elsevier Patient Education  2024 Elsevier Inc.  EXERCISE AND DIET:  We recommended that you start or continue a regular exercise program for good health. Regular exercise means any activity that makes your heart beat faster and makes you sweat.  We recommend exercising at least 30 minutes per day at least 3 days a week,  preferably 4 or 5.  We also recommend a diet low in fat and sugar.  Inactivity, poor dietary choices and obesity can cause diabetes, heart attack, stroke, and kidney damage, among others.    ALCOHOL AND SMOKING:  Women should limit their alcohol intake to no more than 7 drinks/beers/glasses of wine (combined, not each!) per week. Moderation of alcohol intake to this level decreases your risk of breast cancer and liver damage. And of course, no recreational drugs are part of a healthy lifestyle.  And absolutely no smoking or even second hand smoke. Most people know smoking can cause heart and lung diseases, but did you know it also contributes to weakening of your bones? Aging of your skin?  Yellowing of your teeth and nails?  CALCIUM  AND VITAMIN D :  Adequate intake of calcium  and Vitamin D  are recommended.  The recommendations for exact amounts of these supplements seem to change often, but generally speaking 600 mg of calcium  (either carbonate or citrate) and 800 units of Vitamin D  per day seems prudent. Certain women may benefit from higher intake of Vitamin D .  If you are among these women, your doctor will have told you during your visit.    PAP SMEARS:  Pap smears, to check for cervical cancer or precancers,  have traditionally been done yearly, although recent scientific advances have shown that most women can have pap smears less often.  However, every woman still should have a physical exam from her gynecologist every year. It  will include a breast check, inspection of the vulva and vagina to check for abnormal growths or skin changes, a visual exam of the cervix, and then an exam to evaluate the size and shape of the uterus and ovaries.  And after 71 years of age, a rectal exam is indicated to check for rectal cancers. We will also provide age appropriate advice regarding health maintenance, like when you should have certain vaccines, screening for sexually transmitted diseases, bone density testing,  colonoscopy, mammograms, etc.   MAMMOGRAMS:  All women over 77 years old should have a yearly mammogram. Many facilities now offer a "3D" mammogram, which may cost around $50 extra out of pocket. If possible,  we recommend you accept the option to have the 3D mammogram performed.  It both reduces the number of women who will be called back for extra views which then turn out to be normal, and it is better than the routine mammogram at detecting truly abnormal areas.    COLONOSCOPY:  Colonoscopy to screen for colon cancer is recommended for all women at age 48.  We know, you hate the idea of the prep.  We agree, BUT, having colon cancer and not knowing it is worse!!  Colon cancer so often starts as a polyp that can be seen and removed at colonscopy, which can quite literally save your life!  And if your first colonoscopy is normal and you have no family history of colon cancer, most women don't have to have it again for 10 years.  Once every ten years, you can do something that may end up saving your life, right?  We will be happy to help you get it scheduled when you are ready.  Be sure to check your insurance coverage so you understand how much it will cost.  It may be covered as a preventative service at no cost, but you should check your particular policy.

## 2023-12-23 NOTE — Progress Notes (Addendum)
 73 y.o. G11P0000 Married Caucasian female here for a breast and pelvic exam.    The patient is also followed for osteopenia. BMD 07/24/23:  right hip:  -1.6, left forearm -1.6.  FRAX:  14.8%, 2.1%.  Fractured her left shoulder after a fall 3 years ago.  Would like to loose weight.   Had a first UTI in June.   Pelvic exams are uncomfortable.   Retired Programmer, systems.  Husband will retire in July.  Volunteers.   PCP: Roslyn Coombe, MD   No LMP recorded (lmp unknown). Patient has had a hysterectomy.           Sexually active: No.  The current method of family planning is status post hysterectomy.    Menopausal hormone therapy:  n/a Exercising: No.   Smoker:  Former  OB History     Gravida  0   Para  0   Term  0   Preterm  0   AB  0   Living  0      SAB  0   IAB  0   Ectopic  0   Multiple  0   Live Births              HEALTH MAINTENANCE: Last 2 paps: 10/02/09 normal, 03/28/08 normal  History of abnormal Pap or positive HPV:  no Mammogram:  10/12/23 Breast Density Cat C, BIRADS cat 1 neg  Colonoscopy:  10/28/19 Bone Density:  07/24/23  Result  osteopenic    Immunization History  Administered Date(s) Administered   Fluad Quad(high Dose 65+) 03/31/2019, 04/19/2020, 04/09/2021, 04/30/2022   Fluad Trivalent(High Dose 65+) 04/28/2023   Influenza Split 06/02/2011, 06/02/2012   Influenza, Seasonal, Injecte, Preservative Fre 06/02/2013   Influenza,inj,Quad PF,6+ Mos 05/19/2014, 04/27/2015, 04/24/2016, 05/01/2017, 05/11/2018   PFIZER(Purple Top)SARS-COV-2 Vaccination 09/09/2019, 10/04/2019, 05/10/2020, 11/10/2020   Pneumococcal Conjugate-13 12/30/2018   Pneumococcal Polysaccharide-23 01/10/2020   Td 03/13/2010   Tdap 01/10/2021   Zoster Recombinant(Shingrix) 05/16/2019, 08/23/2019   Zoster, Live 07/11/2014      reports that she quit smoking about 49 years ago. Her smoking use included cigarettes. She has never used smokeless tobacco. She reports current  alcohol use of about 3.0 - 4.0 standard drinks of alcohol per week. She reports that she does not use drugs.  Past Medical History:  Diagnosis Date   ALLERGIC RHINITIS 04/04/2007   Allergy    BURSITIS, RIGHT HIP 04/04/2007   COLONIC POLYPS, HX OF 04/04/2007   FATIGUE 05/12/2007   Fracture of left shoulder 2022   occurred from a fall while hiking   HYPERLIPIDEMIA 04/04/2007   LATERAL EPICONDYLITIS, RIGHT 03/13/2010   MASTITIS 08/11/2008   OSTEOARTHRITIS 04/04/2007   OSTEOARTHRITIS, KNEE, RIGHT 02/12/2009   OSTEOPENIA 03/13/2010   Palpitations 05/12/2007   stress/anxiety related   Unspecified vitamin D  deficiency 02/12/2009    Past Surgical History:  Procedure Laterality Date   ABDOMINAL HYSTERECTOMY  2002   BLEPHAROPLASTY Bilateral 2015   COLONOSCOPY  09/29/2014   Willy Harvest- ssp polyp   COSMETIC SURGERY  2014   eye lift   DENTAL SURGERY  08/2019   perm implant   hemorrhoidectomoy  1988   kidney stone removal  2005   OOPHORECTOMY  2002    Current Outpatient Medications  Medication Sig Dispense Refill   atorvastatin  (LIPITOR) 80 MG tablet Take 1 tablet (80 mg total) by mouth daily. 90 tablet 3   Calcium  Carb-Cholecalciferol (CALCIUM  500 + D3 PO) Take by mouth.  Cholecalciferol (VITAMIN D ) 2000 units tablet Take 1 tablet (2,000 Units total) by mouth daily. 30 tablet 11   estradiol (ESTRACE) 0.1 MG/GM vaginal cream Use 1/2 g vaginally every night for the first 2 weeks, then use 1/2 g vaginally two or three times per week as needed to maintain symptom relief.  Each time you use the cream internally, also place a pea size amount to the urethra. 42.5 g 2   Magnesium Oxide 250 MG TABS Take 250 mg by mouth daily.     vitamin C (ASCORBIC ACID) 500 MG tablet Take 500 mg by mouth daily.     VITAMIN K PO Take 20 mcg by mouth daily.     glycopyrrolate (ROBINUL) 1 MG tablet Take 1 mg by mouth daily. And increase as needed (Patient not taking: Reported on 12/23/2023)     No current  facility-administered medications for this visit.    ALLERGIES: Sulfonamide derivatives  Family History  Problem Relation Age of Onset   Gastric cancer Paternal Grandfather    Heart disease Mother        died at 27yo   Heart disease Father        died at 52yo   Stomach cancer Maternal Grandfather    Colon cancer Neg Hx    Rectal cancer Neg Hx    Esophageal cancer Neg Hx     Review of Systems  All other systems reviewed and are negative.   PHYSICAL EXAM:  BP 124/80 (BP Location: Left Arm, Patient Position: Sitting)   Pulse 82   Ht 5\' 3"  (1.6 m)   Wt 205 lb (93 kg)   LMP  (LMP Unknown)   SpO2 98%   BMI 36.31 kg/m     General appearance: alert, cooperative and appears stated age Head: normocephalic, without obvious abnormality, atraumatic Neck: no adenopathy, supple, symmetrical, trachea midline and thyroid  normal to inspection and palpation Lungs: clear to auscultation bilaterally Breasts: normal appearance, no masses or tenderness, Bilateral inverted nipples (no change), No nipple discharge or bleeding, No axillary adenopathy Heart: regular rate and rhythm Abdomen: soft, non-tender; no masses, no organomegaly Extremities: extremities normal, atraumatic, no cyanosis or edema Skin: skin color, texture, turgor normal. No rashes or lesions Lymph nodes: cervical, supraclavicular, and axillary nodes normal. Neurologic: grossly normal  Pelvic: External genitalia:  no lesions              No abnormal inguinal nodes palpated.              Urethra:  normal appearing urethra with no masses, tenderness or lesions              Bartholins and Skenes: normal                 Vagina: normal appearing vagina with normal color and discharge, no lesions              Cervix: absent              Pap taken: no Bimanual Exam:  Uterus:  absent              Adnexa: no mass, fullness, tenderness              Rectal exam: yes.  Confirms.              Anus:  normal sphincter tone, no  lesions  Chaperone was present for exam:  Cottie Diss, CMA  ASSESSMENT: Encounter for breast and pelvic exam.  Hx  TAH/BSO.  Endometriosis.  Atrophy.  Hx UTI. Osteopenia. Multiple sites. Hx left shoulder fracture. Bilateral nipple inversion.  No change.   PLAN: Mammogram screening discussed. Self breast awareness reviewed. Pap and HRV collected:  no.  Not indicated.  Guidelines for Calcium , Vitamin D , regular exercise program including cardiovascular and weight bearing exercise. Medication refills:  Estrace cream.  Instructed in use. Benefit of treating atrophy and reducing risk of urinary tract infection reviewed.  I discussed potential effect on breast cancer.  BMD in 2026.  Labs with PCP.  Follow up:  yearly and prn.    Additional counseling given.  yes. 20 min  total time was spent for this patient encounter, including preparation, face-to-face counseling with the patient, coordination of care, and documentation of the encounter in addition to doing the breast and pelvic exam.

## 2024-01-07 ENCOUNTER — Other Ambulatory Visit (INDEPENDENT_AMBULATORY_CARE_PROVIDER_SITE_OTHER)

## 2024-01-07 DIAGNOSIS — E559 Vitamin D deficiency, unspecified: Secondary | ICD-10-CM

## 2024-01-07 DIAGNOSIS — R739 Hyperglycemia, unspecified: Secondary | ICD-10-CM | POA: Diagnosis not present

## 2024-01-07 DIAGNOSIS — E785 Hyperlipidemia, unspecified: Secondary | ICD-10-CM

## 2024-01-07 DIAGNOSIS — E538 Deficiency of other specified B group vitamins: Secondary | ICD-10-CM

## 2024-01-07 LAB — URINALYSIS, ROUTINE W REFLEX MICROSCOPIC
Bilirubin Urine: NEGATIVE
Hgb urine dipstick: NEGATIVE
Ketones, ur: NEGATIVE
Leukocytes,Ua: NEGATIVE
Nitrite: NEGATIVE
RBC / HPF: NONE SEEN (ref 0–?)
Specific Gravity, Urine: 1.02 (ref 1.000–1.030)
Total Protein, Urine: NEGATIVE
Urine Glucose: NEGATIVE
Urobilinogen, UA: 0.2 (ref 0.0–1.0)
pH: 6 (ref 5.0–8.0)

## 2024-01-07 LAB — CBC WITH DIFFERENTIAL/PLATELET
Basophils Absolute: 0 10*3/uL (ref 0.0–0.1)
Basophils Relative: 0.7 % (ref 0.0–3.0)
Eosinophils Absolute: 0.1 10*3/uL (ref 0.0–0.7)
Eosinophils Relative: 1.8 % (ref 0.0–5.0)
HCT: 41.9 % (ref 36.0–46.0)
Hemoglobin: 14.2 g/dL (ref 12.0–15.0)
Lymphocytes Relative: 15.4 % (ref 12.0–46.0)
Lymphs Abs: 0.9 10*3/uL (ref 0.7–4.0)
MCHC: 33.8 g/dL (ref 30.0–36.0)
MCV: 85.4 fl (ref 78.0–100.0)
Monocytes Absolute: 0.6 10*3/uL (ref 0.1–1.0)
Monocytes Relative: 10.8 % (ref 3.0–12.0)
Neutro Abs: 4.1 10*3/uL (ref 1.4–7.7)
Neutrophils Relative %: 71.3 % (ref 43.0–77.0)
Platelets: 219 10*3/uL (ref 150.0–400.0)
RBC: 4.91 Mil/uL (ref 3.87–5.11)
RDW: 13.4 % (ref 11.5–15.5)
WBC: 5.8 10*3/uL (ref 4.0–10.5)

## 2024-01-07 LAB — HEMOGLOBIN A1C: Hgb A1c MFr Bld: 5.7 % (ref 4.6–6.5)

## 2024-01-07 LAB — LIPID PANEL
Cholesterol: 166 mg/dL (ref 0–200)
HDL: 47 mg/dL (ref >39.00)
LDL Cholesterol: 94 mg/dL (ref 0–99)
NonHDL: 118.95
Total CHOL/HDL Ratio: 4
Triglycerides: 125 mg/dL (ref 0.0–149.0)
VLDL: 25 mg/dL (ref 0.0–40.0)

## 2024-01-07 LAB — HEPATIC FUNCTION PANEL
ALT: 31 U/L (ref 0–35)
AST: 26 U/L (ref 0–37)
Albumin: 4.1 g/dL (ref 3.5–5.2)
Alkaline Phosphatase: 111 U/L (ref 39–117)
Bilirubin, Direct: 0.1 mg/dL (ref 0.0–0.3)
Total Bilirubin: 0.8 mg/dL (ref 0.2–1.2)
Total Protein: 6.7 g/dL (ref 6.0–8.3)

## 2024-01-07 LAB — VITAMIN D 25 HYDROXY (VIT D DEFICIENCY, FRACTURES): VITD: 73.59 ng/mL (ref 30.00–100.00)

## 2024-01-07 LAB — BASIC METABOLIC PANEL WITH GFR
BUN: 16 mg/dL (ref 6–23)
CO2: 27 meq/L (ref 19–32)
Calcium: 10.4 mg/dL (ref 8.4–10.5)
Chloride: 104 meq/L (ref 96–112)
Creatinine, Ser: 0.66 mg/dL (ref 0.40–1.20)
GFR: 88.75 mL/min (ref >60.00)
Glucose, Bld: 94 mg/dL (ref 70–99)
Potassium: 4.6 meq/L (ref 3.5–5.1)
Sodium: 142 meq/L (ref 135–145)

## 2024-01-07 LAB — VITAMIN B12: Vitamin B-12: 421 pg/mL (ref 211–911)

## 2024-01-07 LAB — TSH: TSH: 2.38 u[IU]/mL (ref 0.35–5.50)

## 2024-01-21 ENCOUNTER — Ambulatory Visit (INDEPENDENT_AMBULATORY_CARE_PROVIDER_SITE_OTHER): Payer: BC Managed Care – PPO | Admitting: Internal Medicine

## 2024-01-21 ENCOUNTER — Encounter: Payer: Self-pay | Admitting: Internal Medicine

## 2024-01-21 VITALS — BP 122/78 | HR 75 | Temp 98.5°F | Ht 63.0 in | Wt 204.0 lb

## 2024-01-21 DIAGNOSIS — M24572 Contracture, left ankle: Secondary | ICD-10-CM | POA: Insufficient documentation

## 2024-01-21 DIAGNOSIS — E559 Vitamin D deficiency, unspecified: Secondary | ICD-10-CM

## 2024-01-21 DIAGNOSIS — R739 Hyperglycemia, unspecified: Secondary | ICD-10-CM | POA: Diagnosis not present

## 2024-01-21 DIAGNOSIS — M5431 Sciatica, right side: Secondary | ICD-10-CM

## 2024-01-21 DIAGNOSIS — Z0000 Encounter for general adult medical examination without abnormal findings: Secondary | ICD-10-CM | POA: Diagnosis not present

## 2024-01-21 DIAGNOSIS — E7800 Pure hypercholesterolemia, unspecified: Secondary | ICD-10-CM | POA: Diagnosis not present

## 2024-01-21 DIAGNOSIS — E785 Hyperlipidemia, unspecified: Secondary | ICD-10-CM

## 2024-01-21 DIAGNOSIS — Z0001 Encounter for general adult medical examination with abnormal findings: Secondary | ICD-10-CM

## 2024-01-21 MED ORDER — ATORVASTATIN CALCIUM 80 MG PO TABS: 80.0000 mg | ORAL_TABLET | Freq: Every day | ORAL | 3 refills | Status: AC

## 2024-01-21 NOTE — Patient Instructions (Addendum)
Please continue all other medications as before, and refills have been done if requested.  Please have the pharmacy call with any other refills you may need.  Please continue your efforts at being more active, low cholesterol diet, and weight control.  You are otherwise up to date with prevention measures today.  Please keep your appointments with your specialists as you may have planned  Please make an Appointment to return for your 1 year visit, or sooner if needed, with Lab testing by Appointment as well, to be done about 3-5 days before at the FIRST FLOOR Lab (so this is for TWO appointments - please see the scheduling desk as you leave)    

## 2024-01-21 NOTE — Progress Notes (Unsigned)
 Patient ID: [name], female   DOB: 03-20-1951, 73 y.o.   MRN: [id number]         Chief Complaint:: wellness exam and hld, low vit d, hyperglycemia       HPI:  [name] is a 73 y.o. female here for wellness exam;                         Also***  Wt up 10 lbs.   Wt Readings from Last 3 Encounters:  12/23/23 205 lb (93 kg)  01/20/23 196 lb (88.9 kg)  01/08/23 196 lb 6 oz (89.1 kg)   BP Readings from Last 3 Encounters:  12/23/23 124/80  01/20/23 124/76  01/08/23 98/60   Immunization History  Administered Date(s) Administered   Fluad Quad(high Dose 65+) 03/31/2019, 04/19/2020, 04/09/2021, 04/30/2022   Fluad Trivalent(High Dose 65+) 04/28/2023   Influenza Split 06/02/2011, 06/02/2012   Influenza, Seasonal, Injecte, Preservative Fre 06/02/2013   Influenza,inj,Quad PF,6+ Mos 05/19/2014, 04/27/2015, 04/24/2016, 05/01/2017, 05/11/2018   PFIZER(Purple Top)SARS-COV-2 Vaccination 09/09/2019, 10/04/2019, 05/10/2020, 11/10/2020   Pneumococcal Conjugate-13 12/30/2018   Pneumococcal Polysaccharide-23 01/10/2020   Td 03/13/2010   Tdap 01/10/2021   Zoster Recombinant(Shingrix) 05/16/2019, 08/23/2019   Zoster, Live 07/11/2014   Health Maintenance Due  Topic Date Due   Medicare Annual Wellness (AWV)  Never done   COVID-19 Vaccine (5 - 2024-25 season) 04/05/2023      Past Medical History:  Diagnosis Date   ALLERGIC RHINITIS 04/04/2007   Allergy    BURSITIS, RIGHT HIP 04/04/2007   COLONIC POLYPS, HX OF 04/04/2007   FATIGUE 05/12/2007   Fracture of left shoulder 2022   occurred from a fall while hiking   HYPERLIPIDEMIA 04/04/2007   LATERAL EPICONDYLITIS, RIGHT 03/13/2010   MASTITIS 08/11/2008   OSTEOARTHRITIS 04/04/2007   OSTEOARTHRITIS, KNEE, RIGHT 02/12/2009   OSTEOPENIA 03/13/2010   Palpitations 05/12/2007   stress/anxiety related   Unspecified vitamin D  deficiency 02/12/2009   Past Surgical History:  Procedure Laterality Date   ABDOMINAL HYSTERECTOMY  2002    BLEPHAROPLASTY Bilateral 2015   COLONOSCOPY  09/29/2014   Willy Harvest- ssp polyp   COSMETIC SURGERY  2014   eye lift   DENTAL SURGERY  08/2019   perm implant   hemorrhoidectomoy  1988   kidney stone removal  2005   OOPHORECTOMY  2002    reports that she quit smoking about 49 years ago. Her smoking use included cigarettes. She has never used smokeless tobacco. She reports current alcohol use of about 3.0 - 4.0 standard drinks of alcohol per week. She reports that she does not use drugs. family history includes Gastric cancer in her paternal grandfather; Heart disease in her father and mother; Stomach cancer in her maternal grandfather. Allergies  Allergen Reactions   Sulfonamide Derivatives Swelling   Current Outpatient Medications on File Prior to Visit  Medication Sig Dispense Refill   atorvastatin  (LIPITOR) 80 MG tablet Take 1 tablet (80 mg total) by mouth daily. 90 tablet 3   Calcium  Carb-Cholecalciferol (CALCIUM  500 + D3 PO) Take by mouth.     Cholecalciferol (VITAMIN D ) 2000 units tablet Take 1 tablet (2,000 Units total) by mouth daily. 30 tablet 11   estradiol  (ESTRACE ) 0.1 MG/GM vaginal cream Use 1/2 g vaginally every night for the first 2 weeks, then use 1/2 g vaginally two or three times per week as needed to maintain symptom relief.  Each time you use the cream internally, also place a pea  size amount to the urethra. 42.5 g 2   glycopyrrolate (ROBINUL) 1 MG tablet Take 1 mg by mouth daily. And increase as needed (Patient not taking: Reported on 12/23/2023)     Magnesium Oxide 250 MG TABS Take 250 mg by mouth daily.     vitamin C (ASCORBIC ACID) 500 MG tablet Take 500 mg by mouth daily.     VITAMIN K PO Take 20 mcg by mouth daily.     No current facility-administered medications on file prior to visit.        ROS:  All others reviewed and negative.  Objective        PE:  LMP  (LMP Unknown)                 Constitutional: Pt appears in NAD               HENT: Head: NCAT.                 Right Ear: External ear normal.                 Left Ear: External ear normal.                Eyes: . Pupils are equal, round, and reactive to light. Conjunctivae and EOM are normal               Nose: without d/c or deformity               Neck: Neck supple. Gross normal ROM               Cardiovascular: Normal rate and regular rhythm.                 Pulmonary/Chest: Effort normal and breath sounds without rales or wheezing.                Abd:  Soft, NT, ND, + BS, no organomegaly               Neurological: Pt is alert. At baseline orientation, motor grossly intact               Skin: Skin is warm. No rashes, no other new lesions, LE edema - ***               Psychiatric: Pt behavior is normal without agitation   Micro: none  Cardiac tracings I have personally interpreted today:  none  Pertinent Radiological findings (summarize): none   Lab Results  Component Value Date   WBC 5.8 01/07/2024   HGB 14.2 01/07/2024   HCT 41.9 01/07/2024   PLT 219.0 01/07/2024   GLUCOSE 94 01/07/2024   CHOL 166 01/07/2024   TRIG 125.0 01/07/2024   HDL 47.00 01/07/2024   LDLDIRECT 162.0 03/06/2010   LDLCALC 94 01/07/2024   ALT 31 01/07/2024   AST 26 01/07/2024   NA 142 01/07/2024   K 4.6 01/07/2024   CL 104 01/07/2024   CREATININE 0.66 01/07/2024   BUN 16 01/07/2024   CO2 27 01/07/2024   TSH 2.38 01/07/2024   INR 1.0 01/18/2019   HGBA1C 5.7 01/07/2024   MICROALBUR <0.7 01/15/2023   Assessment/Plan:  [name] is a 73 y.o. White or Caucasian [1] female with  has a past medical history of ALLERGIC RHINITIS (04/04/2007), Allergy, BURSITIS, RIGHT HIP (04/04/2007), COLONIC POLYPS, HX OF (04/04/2007), FATIGUE (05/12/2007), Fracture of left shoulder (2022), HYPERLIPIDEMIA (04/04/2007), LATERAL EPICONDYLITIS, RIGHT (03/13/2010), MASTITIS (08/11/2008),  OSTEOARTHRITIS (04/04/2007), OSTEOARTHRITIS, KNEE, RIGHT (02/12/2009), OSTEOPENIA (03/13/2010), Palpitations (05/12/2007), and  Unspecified vitamin D  deficiency (02/12/2009).  No problem-specific Assessment & Plan notes found for this encounter.  Followup: No follow-ups on file.  Rosalia Colonel, MD 01/21/2024 9:26 AM [facility]

## 2024-01-24 ENCOUNTER — Encounter: Payer: Self-pay | Admitting: Internal Medicine

## 2024-01-24 DIAGNOSIS — M5431 Sciatica, right side: Secondary | ICD-10-CM | POA: Insufficient documentation

## 2024-01-24 DIAGNOSIS — Z0001 Encounter for general adult medical examination with abnormal findings: Secondary | ICD-10-CM | POA: Insufficient documentation

## 2024-01-24 NOTE — Assessment & Plan Note (Signed)

## 2024-01-24 NOTE — Assessment & Plan Note (Signed)
 Last vitamin D  Lab Results  Component Value Date   VD25OH 73.59 01/07/2024   Stable, cont oral replacement

## 2024-01-24 NOTE — Assessment & Plan Note (Signed)
 Lab Results  Component Value Date   HGBA1C 5.7 01/07/2024   Stable, pt to continue current medical treatment - diet, wt control

## 2024-01-24 NOTE — Assessment & Plan Note (Signed)
 Lab Results  Component Value Date   LDLCALC 94 01/07/2024   Stable, pt to continue current statin lipitor 80 mg qd

## 2024-01-24 NOTE — Assessment & Plan Note (Signed)
 Recent onset mild intermiittent, declines imiaging or tx for now, pt encouraged to f/u with sport medicine for any worsening

## 2024-02-11 ENCOUNTER — Ambulatory Visit (INDEPENDENT_AMBULATORY_CARE_PROVIDER_SITE_OTHER): Admitting: Internal Medicine

## 2024-02-11 ENCOUNTER — Encounter: Payer: Self-pay | Admitting: Internal Medicine

## 2024-02-11 VITALS — BP 102/64 | HR 73 | Temp 98.1°F | Ht 63.0 in | Wt 204.2 lb

## 2024-02-11 DIAGNOSIS — E559 Vitamin D deficiency, unspecified: Secondary | ICD-10-CM | POA: Diagnosis not present

## 2024-02-11 DIAGNOSIS — R739 Hyperglycemia, unspecified: Secondary | ICD-10-CM

## 2024-02-11 DIAGNOSIS — E7800 Pure hypercholesterolemia, unspecified: Secondary | ICD-10-CM | POA: Diagnosis not present

## 2024-02-11 DIAGNOSIS — Z0000 Encounter for general adult medical examination without abnormal findings: Secondary | ICD-10-CM

## 2024-02-11 NOTE — Progress Notes (Signed)
 Patient ID: [name], female   DOB: 1951/06/13, 73 y.o.   MRN: [id number]        Chief Complaint: follow up low vit d, hld, hyperglycemia       HPI:  [name] is a 73 y.o. female here overall doing ok, Pt denies chest pain, increased sob or doe, wheezing, orthopnea, PND, increased LE swelling, palpitations, dizziness or syncope.   Pt denies polydipsia, polyuria, or new focal neuro s/s.    Pt denies fever, wt loss, night sweats, loss of appetite, or other constitutional symptoms  Started walking this week 30 min per day.    Wt Readings from Last 3 Encounters:  02/11/24 204 lb 3.2 oz (92.6 kg)  01/21/24 204 lb (92.5 kg)  12/23/23 205 lb (93 kg)   BP Readings from Last 3 Encounters:  02/11/24 102/64  01/21/24 122/78  12/23/23 124/80         Past Medical History:  Diagnosis Date   ALLERGIC RHINITIS 04/04/2007   Allergy    BURSITIS, RIGHT HIP 04/04/2007   COLONIC POLYPS, HX OF 04/04/2007   FATIGUE 05/12/2007   Fracture of left shoulder 2022   occurred from a fall while hiking   HYPERLIPIDEMIA 04/04/2007   LATERAL EPICONDYLITIS, RIGHT 03/13/2010   MASTITIS 08/11/2008   OSTEOARTHRITIS 04/04/2007   OSTEOARTHRITIS, KNEE, RIGHT 02/12/2009   OSTEOPENIA 03/13/2010   Palpitations 05/12/2007   stress/anxiety related   Unspecified vitamin D  deficiency 02/12/2009   Past Surgical History:  Procedure Laterality Date   ABDOMINAL HYSTERECTOMY  2002   BLEPHAROPLASTY Bilateral 2015   COLONOSCOPY  09/29/2014   Avram- ssp polyp   COSMETIC SURGERY  2014   eye lift   DENTAL SURGERY  08/2019   perm implant   hemorrhoidectomoy  1988   kidney stone removal  2005   OOPHORECTOMY  2002    reports that she quit smoking about 49 years ago. Her smoking use included cigarettes. She has never used smokeless tobacco. She reports current alcohol use of about 3.0 - 4.0 standard drinks of alcohol per week. She reports that she does not use drugs. family history includes Gastric cancer  in her paternal grandfather; Heart disease in her father and mother; Stomach cancer in her maternal grandfather. Allergies  Allergen Reactions   Sulfonamide Derivatives Swelling   Current Outpatient Medications on File Prior to Visit  Medication Sig Dispense Refill   atorvastatin  (LIPITOR) 80 MG tablet Take 1 tablet (80 mg total) by mouth daily. 90 tablet 3   Calcium  Carb-Cholecalciferol (CALCIUM  500 + D3 PO) Take by mouth.     Cholecalciferol (VITAMIN D ) 2000 units tablet Take 1 tablet (2,000 Units total) by mouth daily. 30 tablet 11   estradiol  (ESTRACE ) 0.1 MG/GM vaginal cream Use 1/2 g vaginally every night for the first 2 weeks, then use 1/2 g vaginally two or three times per week as needed to maintain symptom relief.  Each time you use the cream internally, also place a pea size amount to the urethra. 42.5 g 2   Magnesium Oxide 250 MG TABS Take 250 mg by mouth daily.     vitamin C (ASCORBIC ACID) 500 MG tablet Take 500 mg by mouth daily.     VITAMIN K PO Take 20 mcg by mouth daily.     No current facility-administered medications on file prior to visit.        ROS:  All others reviewed and negative.  Objective  PE:  BP 102/64   Pulse 73   Temp 98.1 F (36.7 C)   Ht 5' 3 (1.6 m)   Wt 204 lb 3.2 oz (92.6 kg)   LMP  (LMP Unknown)   SpO2 99%   BMI 36.17 kg/m                 Constitutional: Pt appears in NAD               HENT: Head: NCAT.                Right Ear: External ear normal.                 Left Ear: External ear normal.                Eyes: . Pupils are equal, round, and reactive to light. Conjunctivae and EOM are normal               Nose: without d/c or deformity               Neck: Neck supple. Gross normal ROM               Cardiovascular: Normal rate and regular rhythm.                 Pulmonary/Chest: Effort normal and breath sounds without rales or wheezing.                Abd:  Soft, NT, ND, + BS, no organomegaly               Neurological: Pt is  alert. At baseline orientation, motor grossly intact               Skin: Skin is warm. No rashes, no other new lesions, LE edema - none               Psychiatric: Pt behavior is normal without agitation   Micro: none  Cardiac tracings I have personally interpreted today:  ECG - NSR 64  Pertinent Radiological findings (summarize): none   Lab Results  Component Value Date   WBC 5.8 01/07/2024   HGB 14.2 01/07/2024   HCT 41.9 01/07/2024   PLT 219.0 01/07/2024   GLUCOSE 94 01/07/2024   CHOL 166 01/07/2024   TRIG 125.0 01/07/2024   HDL 47.00 01/07/2024   LDLDIRECT 162.0 03/06/2010   LDLCALC 94 01/07/2024   ALT 31 01/07/2024   AST 26 01/07/2024   NA 142 01/07/2024   K 4.6 01/07/2024   CL 104 01/07/2024   CREATININE 0.66 01/07/2024   BUN 16 01/07/2024   CO2 27 01/07/2024   TSH 2.38 01/07/2024   INR 1.0 01/18/2019   HGBA1C 5.7 01/07/2024   Assessment/Plan:  [name] is a 73 y.o. White or Caucasian [1] female with  has a past medical history of ALLERGIC RHINITIS (04/04/2007), Allergy, BURSITIS, RIGHT HIP (04/04/2007), COLONIC POLYPS, HX OF (04/04/2007), FATIGUE (05/12/2007), Fracture of left shoulder (2022), HYPERLIPIDEMIA (04/04/2007), LATERAL EPICONDYLITIS, RIGHT (03/13/2010), MASTITIS (08/11/2008), OSTEOARTHRITIS (04/04/2007), OSTEOARTHRITIS, KNEE, RIGHT (02/12/2009), OSTEOPENIA (03/13/2010), Palpitations (05/12/2007), and Unspecified vitamin D  deficiency (02/12/2009).  Vitamin D  deficiency Last vitamin D  Lab Results  Component Value Date   VD25OH 73.59 01/07/2024   Stable, cont oral replacement   Hyperlipidemia Lab Results  Component Value Date   LDLCALC 94 01/07/2024   Stable, pt to continue current statin lipitor 80 mg qd   Hyperglycemia Lab Results  Component Value  Date   HGBA1C 5.7 01/07/2024   Stable, pt to continue current medical treatment  - diet, wt control  Followup: No follow-ups on file.  Lynwood Rush, MD 02/13/2024 2:27 PM [facility] [place] [facility] [facility] [place] [facility]

## 2024-02-11 NOTE — Patient Instructions (Signed)
 Your EKG was done today  Please continue all other medications as before, and refills have been done if requested.  Please have the pharmacy call with any other refills you may need.  Please continue your efforts at being more active, low cholesterol diet, and weight control.  Please keep your appointments with your specialists as you may have planned  Your lab work was very good.  Please make an Appointment to return in 6 months, or sooner if needed

## 2024-02-13 NOTE — Assessment & Plan Note (Signed)
 Lab Results  Component Value Date   LDLCALC 94 01/07/2024   Stable, pt to continue current statin lipitor 80 mg qd

## 2024-02-13 NOTE — Assessment & Plan Note (Signed)
 Lab Results  Component Value Date   HGBA1C 5.7 01/07/2024   Stable, pt to continue current medical treatment - diet, wt control

## 2024-02-13 NOTE — Assessment & Plan Note (Signed)
 Last vitamin D  Lab Results  Component Value Date   VD25OH 73.59 01/07/2024   Stable, cont oral replacement

## 2024-02-17 ENCOUNTER — Encounter: Payer: Self-pay | Admitting: Internal Medicine

## 2024-02-17 DIAGNOSIS — R9431 Abnormal electrocardiogram [ECG] [EKG]: Secondary | ICD-10-CM

## 2024-03-24 DIAGNOSIS — K080 Exfoliation of teeth due to systemic causes: Secondary | ICD-10-CM | POA: Diagnosis not present

## 2024-04-06 DIAGNOSIS — M8580 Other specified disorders of bone density and structure, unspecified site: Secondary | ICD-10-CM | POA: Diagnosis not present

## 2024-05-04 ENCOUNTER — Ambulatory Visit

## 2024-05-04 DIAGNOSIS — Z23 Encounter for immunization: Secondary | ICD-10-CM

## 2024-05-09 ENCOUNTER — Encounter: Payer: Self-pay | Admitting: Cardiovascular Disease

## 2024-05-09 ENCOUNTER — Ambulatory Visit: Attending: Cardiovascular Disease | Admitting: Cardiovascular Disease

## 2024-05-09 VITALS — BP 136/78 | HR 69 | Ht 63.0 in | Wt 199.0 lb

## 2024-05-09 DIAGNOSIS — E782 Mixed hyperlipidemia: Secondary | ICD-10-CM

## 2024-05-09 DIAGNOSIS — R072 Precordial pain: Secondary | ICD-10-CM | POA: Diagnosis not present

## 2024-05-09 DIAGNOSIS — Z8249 Family history of ischemic heart disease and other diseases of the circulatory system: Secondary | ICD-10-CM | POA: Insufficient documentation

## 2024-05-09 DIAGNOSIS — R0609 Other forms of dyspnea: Secondary | ICD-10-CM | POA: Insufficient documentation

## 2024-05-09 DIAGNOSIS — R0789 Other chest pain: Secondary | ICD-10-CM

## 2024-05-09 MED ORDER — METOPROLOL TARTRATE 50 MG PO TABS: ORAL_TABLET | ORAL | 0 refills | Status: AC

## 2024-05-09 NOTE — Assessment & Plan Note (Signed)
 Patient complains of atypical chest pain especially when performing aerobic activity.  It should be noted that she did have a coronary calcium  score of 0 performed 02/23/2020.  I am going to obtain a coronary CTA especially in light of her strong family history.

## 2024-05-09 NOTE — Assessment & Plan Note (Signed)
 Father died of a myocardial infarction at age 71 and mother died of a myocardial infarction at age 56.

## 2024-05-09 NOTE — Patient Instructions (Addendum)
 Medication Instructions:  Your physician recommends that you continue on your current medications as directed. Please refer to the Current Medication list given to you today.  *If you need a refill on your cardiac medications before your next appointment, please call your pharmacy*  Testing/Procedures: Your physician has requested that you have an echocardiogram. Echocardiography is a painless test that uses sound waves to create images of your heart. It provides your doctor with information about the size and shape of your heart and how well your heart's chambers and valves are working. This procedure takes approximately one hour. There are no restrictions for this procedure. Please do NOT wear cologne, perfume, aftershave, or lotions (deodorant is allowed). Please arrive 15 minutes prior to your appointment time.  Please note: We ask at that you not bring children with you during ultrasound (echo/ vascular) testing. Due to room size and safety concerns, children are not allowed in the ultrasound rooms during exams. Our front office staff cannot provide observation of children in our lobby area while testing is being conducted. An adult accompanying a patient to their appointment will only be allowed in the ultrasound room at the discretion of the ultrasound technician under special circumstances. We apologize for any inconvenience.   Follow-Up: At [facility], you and your health needs are our priority.  As part of our continuing mission to provide you with exceptional heart care, our providers are all part of one team.  This team includes your primary Cardiologist (physician) and Advanced Practice Providers or APPs (Physician Assistants and Nurse Practitioners) who all work together to provide you with the care you need, when you need it.  Your next appointment:   We will see you on an as needed basis   Provider:   Dorn Lesches, MD    We recommend signing up for the patient  portal called MyChart.  Sign up information is provided on this After Visit Summary.  MyChart is used to connect with patients for Virtual Visits (Telemedicine).  Patients are able to view lab/test results, encounter notes, upcoming appointments, etc.  Non-urgent messages can be sent to your provider as well.   To learn more about what you can do with MyChart, go to [email or web address].   Other Instructions   Your cardiac CT will be scheduled at the below location:    [facility] [address]  [place], [place] [address] [phone]   If scheduled at the Heart and Vascular Tower at [facility] street, please enter the parking lot using the [address] entrance and use the FREE valet service at the patient drop-off area. Enter the building and check-in with registration on the main floor.   Please follow these instructions carefully (unless otherwise directed):  An IV will be required for this test and Nitroglycerin will be given.  Hold all erectile dysfunction medications at least 3 days (72 hrs) prior to test. (Ie viagra, cialis, sildenafil, tadalafil, etc)   On the Night Before the Test: Be sure to Drink plenty of water. Do not consume any caffeinated/decaffeinated beverages or chocolate 12 hours prior to your test. Do not take any antihistamines 12 hours prior to your test.   On the Day of the Test: Drink plenty of water until 1 hour prior to the test. Do not eat any food 1 hour prior to test. You may take your regular medications prior to the test.  Take metoprolol (Lopressor) 50mg  two hours prior to test. If you take  Furosemide/Hydrochlorothiazide/Spironolactone/Chlorthalidone, please HOLD on the morning of the test. Patients who wear a continuous glucose monitor MUST remove the device prior to scanning. FEMALES- please wear underwire-free bra if available, avoid dresses & tight clothing      After the Test: Drink plenty of  water. After receiving IV contrast, you may experience a mild flushed feeling. This is normal. On occasion, you may experience a mild rash up to 24 hours after the test. This is not dangerous. If this occurs, you can take Benadryl 25 mg, Zyrtec, Claritin, or Allegra and increase your fluid intake. (Patients taking Tikosyn should avoid Benadryl, and may take Zyrtec, Claritin, or Allegra) If you experience trouble breathing, this can be serious. If it is severe call 911 IMMEDIATELY. If it is mild, please call our office.  We will call to schedule your test 2-4 weeks out understanding that some insurance companies will need an authorization prior to the service being performed.   For more information and frequently asked questions, please visit our website : [email or web address]  For non-scheduling related questions, please contact the cardiac imaging nurse navigator should you have any questions/concerns: Cardiac Imaging Nurse Navigators Direct Office Dial: [phone]   For scheduling needs, including cancellations and rescheduling, please call [place], [phone].

## 2024-05-09 NOTE — Assessment & Plan Note (Signed)
 History of hyperlipidemia on statin therapy with lipid profile performed 01/07/2024 revealing total cholesterol of 166, LDL 94 and HDL 47.  She has a goal for primary prevention given her prior coronary calcium  score of 0.

## 2024-05-09 NOTE — Progress Notes (Signed)
 05/09/2024 Medrith T Doi   [birth year]  [id number]  Primary Physician Norleen Lynwood ORN, MD Primary Cardiologist: Dorn JINNY Lesches MD [facility] [place], [place]  HPI:  [name] is a 73 y.o. 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  She was referred by Dr. Lynwood Norleen, her primary care provider, because of family history, dyspnea and atypical chest pain.  Her risk factors include hyperlipidemia on statin therapy.  Both her parents died of myocardial infarction's at age 71 for her father and 45 for her mother.  She is never had a heart attack or stroke.  She has noticed some exertional dyspnea and chest pressure over the last several months.  She had a coronary calcium  score performed 02/23/2020 which was 0.  In addition, she also had an EKG performed 02/11/2024 that did show T wave inversion in the anterior precordial leads.   Current Meds  Medication Sig   atorvastatin  (LIPITOR) 80 MG tablet Take 1 tablet (80 mg total) by mouth daily.   Calcium  Carb-Cholecalciferol (CALCIUM  500 + D3 PO) Take by mouth.   Cholecalciferol (VITAMIN D ) 2000 units tablet Take 1 tablet (2,000 Units total) by mouth daily.   estradiol  (ESTRACE ) 0.1 MG/GM vaginal cream Use 1/2 g vaginally every night for the first 2 weeks, then use 1/2 g vaginally two or three times per week as needed to maintain symptom relief.  Each time you use the cream internally, also place a pea size amount to the urethra.   Magnesium Oxide 250 MG TABS Take 250 mg by mouth daily.   vitamin C (ASCORBIC ACID) 500 MG tablet Take 500 mg by mouth daily.   VITAMIN K PO Take 20 mcg by mouth daily.     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  Tobacco Use   Smoking status: Former    Current packs/day: 0.00    Types: Cigarettes    Quit date: 08/04/1974    Years since quitting: 49.7   Smokeless tobacco: Never   Tobacco comments:    was an occ smoker then  Vaping Use   Vaping status: Never Used  Substance and Sexual Activity   Alcohol use: Yes    Alcohol/week: 3.0 - 4.0 standard drinks of alcohol    Types: 3 - 4 Glasses of wine per week   Drug use: No   Sexual activity: Not Currently    Partners: Male    Birth control/protection: Surgical    Comment: Hysterectomr  Other Topics Concern   Not on file  Social History Narrative   Not on file   Social Drivers of Health   Financial Resource Strain: Low Risk  (02/11/2024)   Overall Financial Resource Strain (CARDIA)    Difficulty of Paying Living Expenses: Not hard at all  Food Insecurity: No Food Insecurity (02/11/2024)   Hunger Vital Sign    Worried About Running Out of Food in the Last Year: Never true    Ran Out of Food in the Last Year: Never true  Transportation Needs: No Transportation Needs (02/11/2024)   PRAPARE -  Administrator, Civil Service (Medical): No    Lack of Transportation (Non-Medical): No  Physical Activity: Sufficiently Active (02/11/2024)   Exercise Vital Sign    Days of Exercise per Week: 7 days    Minutes of Exercise per Session: 30 min  Stress: No Stress Concern Present (02/11/2024)   [employer] of Occupational Health - Occupational Stress Questionnaire    Feeling of Stress: Only a little  Social Connections: Socially Integrated (02/11/2024)   Social Connection and Isolation Panel    Frequency of Communication with Friends and Family: More than three times a week    Frequency of Social Gatherings with Friends and Family: Three times a week    Attends Religious Services: 1 to 4 times per year    Active Member of Clubs or  Organizations: Yes    Attends Banker Meetings: More than 4 times per year    Marital Status: Married  Catering manager Violence: Not At Risk (02/11/2024)   Humiliation, Afraid, Rape, and Kick questionnaire    Fear of Current or Ex-Partner: No    Emotionally Abused: No    Physically Abused: No    Sexually Abused: No     Review of Systems: General: negative for chills, fever, night sweats or weight changes.  Cardiovascular: negative for chest pain, dyspnea on exertion, edema, orthopnea, palpitations, paroxysmal nocturnal dyspnea or shortness of breath Dermatological: negative for rash Respiratory: negative for cough or wheezing Urologic: negative for hematuria Abdominal: negative for nausea, vomiting, diarrhea, bright red blood per rectum, melena, or hematemesis Neurologic: negative for visual changes, syncope, or dizziness All other systems reviewed and are otherwise negative except as noted above.    Blood pressure 136/78, pulse 69, height 5' 3 (1.6 m), weight 199 lb (90.3 kg), SpO2 97%.  General appearance: alert and no distress Neck: no adenopathy, no carotid bruit, no JVD, supple, symmetrical, trachea midline, and thyroid  not enlarged, symmetric, no tenderness/mass/nodules Lungs: clear to auscultation bilaterally Heart: regular rate and rhythm, S1, S2 normal, no murmur, click, rub or gallop Extremities: extremities normal, atraumatic, no cyanosis or edema Pulses: 2+ and symmetric Skin: Skin color, texture, turgor normal. No rashes or lesions Neurologic: Grossly normal  EKG not performed today      ASSESSMENT AND PLAN:   Hyperlipidemia History of hyperlipidemia on statin therapy with lipid profile performed 01/07/2024 revealing total cholesterol of 166, LDL 94 and HDL 47.  She has a goal for primary prevention given her prior coronary calcium  score of 0.  Family history of heart disease Father died of a myocardial infarction at age 21 and mother died of a  myocardial infarction at age 61.  Dyspnea on exertion Patient complains of recent onset of dyspnea on exertion over the last several months when walking up an incline or climbing stairs.  I am going get a 2D echo to further evaluate.  Atypical chest pain Patient complains of atypical chest pain especially when performing aerobic activity.  It should be noted that she did have a coronary calcium  score of 0 performed 02/23/2020.  I am going to obtain a coronary CTA especially in light of her strong family history.     Dorn DOROTHA Lesches MD FACP,FACC,FAHA, [facility] 05/09/2024 11:27 AM

## 2024-05-09 NOTE — Assessment & Plan Note (Signed)
 Patient complains of recent onset of dyspnea on exertion over the last several months when walking up an incline or climbing stairs.  I am going get a 2D echo to further evaluate.

## 2024-05-20 ENCOUNTER — Encounter (HOSPITAL_COMMUNITY): Payer: Self-pay

## 2024-05-24 ENCOUNTER — Ambulatory Visit (HOSPITAL_COMMUNITY)
Admission: RE | Admit: 2024-05-24 | Discharge: 2024-05-24 | Disposition: A | Discharge status: Home / Self Care | Source: Ambulatory Visit | Attending: Cardiovascular Disease | Admitting: Cardiovascular Disease

## 2024-05-24 ENCOUNTER — Ambulatory Visit: Payer: Self-pay | Admitting: Cardiovascular Disease

## 2024-05-24 DIAGNOSIS — R072 Precordial pain: Secondary | ICD-10-CM | POA: Diagnosis not present

## 2024-05-24 MED ORDER — NITROGLYCERIN 0.4 MG SL SUBL
0.8000 mg | SUBLINGUAL_TABLET | Freq: Once | SUBLINGUAL | Status: AC
  Administered 2024-05-24: 0.8 mg via SUBLINGUAL

## 2024-05-24 MED ORDER — IOHEXOL 350 MG/ML SOLN
100.0000 mL | Freq: Once | INTRAVENOUS | Status: AC | PRN
  Administered 2024-05-24: 100 mL via INTRAVENOUS

## 2024-06-20 ENCOUNTER — Ambulatory Visit (HOSPITAL_COMMUNITY)
Admission: RE | Admit: 2024-06-20 | Discharge: 2024-06-20 | Disposition: A | Discharge status: Home / Self Care | Source: Ambulatory Visit | Attending: Cardiology | Admitting: Cardiology

## 2024-06-20 DIAGNOSIS — E782 Mixed hyperlipidemia: Secondary | ICD-10-CM | POA: Insufficient documentation

## 2024-06-20 DIAGNOSIS — R0789 Other chest pain: Secondary | ICD-10-CM | POA: Diagnosis not present

## 2024-06-20 DIAGNOSIS — Z8249 Family history of ischemic heart disease and other diseases of the circulatory system: Secondary | ICD-10-CM | POA: Insufficient documentation

## 2024-06-20 DIAGNOSIS — R072 Precordial pain: Secondary | ICD-10-CM | POA: Insufficient documentation

## 2024-06-20 DIAGNOSIS — R0609 Other forms of dyspnea: Secondary | ICD-10-CM | POA: Diagnosis not present

## 2024-06-20 LAB — ECHOCARDIOGRAM COMPLETE
Area-P 1/2: 3.46 cm2
S' Lateral: 2.9 cm

## 2024-08-09 ENCOUNTER — Telehealth: Payer: Self-pay

## 2024-08-09 NOTE — Telephone Encounter (Signed)
 Copied from CRM [id number]. Topic: General - Other >> Aug 09, 2024 10:45 AM [name] wrote: Reason for CRM: Pt prior to appt on 10/13/2024 with Lauraine Pereyra NP would like to have lab work done. Please call pt [phone] to confirm.

## 2024-08-12 ENCOUNTER — Encounter: Payer: Self-pay | Admitting: Internal Medicine

## 2024-08-12 DIAGNOSIS — M79604 Pain in right leg: Secondary | ICD-10-CM

## 2024-08-31 ENCOUNTER — Encounter: Admitting: Family Medicine

## 2024-09-07 ENCOUNTER — Other Ambulatory Visit: Payer: Self-pay | Admitting: Family Medicine

## 2024-09-07 DIAGNOSIS — Z1231 Encounter for screening mammogram for malignant neoplasm of breast: Secondary | ICD-10-CM

## 2024-09-14 ENCOUNTER — Encounter: Admitting: Family Medicine

## 2024-10-03 ENCOUNTER — Encounter

## 2024-10-13 ENCOUNTER — Ambulatory Visit: Admitting: Nurse Practitioner

## 2024-10-17 ENCOUNTER — Encounter: Admitting: Internal Medicine

## 2024-10-26 ENCOUNTER — Ambulatory Visit

## 2024-11-11 ENCOUNTER — Encounter: Admitting: Family Medicine

## 2024-12-19 ENCOUNTER — Encounter: Admitting: Family Medicine

## 2024-12-27 ENCOUNTER — Ambulatory Visit: Admitting: Obstetrics and Gynecology

## 2025-01-02 ENCOUNTER — Ambulatory Visit: Admitting: Obstetrics and Gynecology

## 2025-01-24 ENCOUNTER — Encounter: Admitting: Internal Medicine

## 2025-02-13 ENCOUNTER — Ambulatory Visit

## 2025-02-17 ENCOUNTER — Encounter: Admitting: Internal Medicine
